# Patient Record
Sex: Female | Born: 1965 | Race: White | Hispanic: No | Marital: Married | Smoking: Never smoker
Health system: Southern US, Community
[De-identification: ages and names within clinical notes are randomized; demographics above are authoritative.]

---

## 1998-07-19 ENCOUNTER — Other Ambulatory Visit: Admission: RE | Admit: 1998-07-19 | Discharge: 1998-07-19 | Payer: Self-pay | Admitting: Obstetrics and Gynecology

## 2000-06-23 ENCOUNTER — Other Ambulatory Visit: Admission: RE | Admit: 2000-06-23 | Discharge: 2000-06-23 | Payer: Self-pay | Admitting: Obstetrics and Gynecology

## 2000-07-06 ENCOUNTER — Encounter: Payer: Self-pay | Admitting: Obstetrics and Gynecology

## 2000-07-06 ENCOUNTER — Ambulatory Visit (HOSPITAL_COMMUNITY): Admission: RE | Admit: 2000-07-06 | Discharge: 2000-07-06 | Payer: Self-pay | Admitting: Obstetrics and Gynecology

## 2005-04-18 ENCOUNTER — Ambulatory Visit (HOSPITAL_BASED_OUTPATIENT_CLINIC_OR_DEPARTMENT_OTHER): Admission: RE | Admit: 2005-04-18 | Discharge: 2005-04-18 | Payer: Self-pay | Admitting: Family Medicine

## 2005-05-04 ENCOUNTER — Ambulatory Visit: Payer: Self-pay | Admitting: Internal Medicine

## 2005-06-25 ENCOUNTER — Other Ambulatory Visit: Admission: RE | Admit: 2005-06-25 | Discharge: 2005-06-25 | Payer: Self-pay | Admitting: Family Medicine

## 2007-11-10 ENCOUNTER — Ambulatory Visit (HOSPITAL_COMMUNITY): Admission: RE | Admit: 2007-11-10 | Discharge: 2007-11-10 | Payer: Self-pay | Admitting: Obstetrics and Gynecology

## 2007-11-10 ENCOUNTER — Encounter (INDEPENDENT_AMBULATORY_CARE_PROVIDER_SITE_OTHER): Payer: Self-pay | Admitting: Obstetrics and Gynecology

## 2007-12-01 ENCOUNTER — Inpatient Hospital Stay (HOSPITAL_COMMUNITY): Admission: RE | Admit: 2007-12-01 | Discharge: 2007-12-07 | Payer: Self-pay | Admitting: Obstetrics and Gynecology

## 2007-12-01 ENCOUNTER — Encounter (INDEPENDENT_AMBULATORY_CARE_PROVIDER_SITE_OTHER): Payer: Self-pay | Admitting: Obstetrics and Gynecology

## 2007-12-06 ENCOUNTER — Encounter: Payer: Self-pay | Admitting: Urology

## 2007-12-15 ENCOUNTER — Ambulatory Visit (HOSPITAL_COMMUNITY): Admission: RE | Admit: 2007-12-15 | Discharge: 2007-12-15 | Payer: Self-pay | Admitting: Urology

## 2008-01-26 ENCOUNTER — Encounter: Admission: RE | Admit: 2008-01-26 | Discharge: 2008-01-26 | Payer: Self-pay | Admitting: Urology

## 2008-02-03 ENCOUNTER — Ambulatory Visit: Admission: RE | Admit: 2008-02-03 | Discharge: 2008-05-03 | Payer: Self-pay | Admitting: Radiation Oncology

## 2008-02-17 ENCOUNTER — Ambulatory Visit (HOSPITAL_COMMUNITY): Admission: RE | Admit: 2008-02-17 | Discharge: 2008-02-17 | Payer: Self-pay | Admitting: Urology

## 2008-03-15 ENCOUNTER — Encounter: Admission: RE | Admit: 2008-03-15 | Discharge: 2008-03-15 | Payer: Self-pay | Admitting: Interventional Radiology

## 2008-03-21 ENCOUNTER — Encounter: Admission: RE | Admit: 2008-03-21 | Discharge: 2008-03-21 | Payer: Self-pay | Admitting: Interventional Radiology

## 2008-04-06 ENCOUNTER — Inpatient Hospital Stay (HOSPITAL_COMMUNITY): Admission: RE | Admit: 2008-04-06 | Discharge: 2008-04-10 | Payer: Self-pay | Admitting: Urology

## 2008-04-06 ENCOUNTER — Encounter (INDEPENDENT_AMBULATORY_CARE_PROVIDER_SITE_OTHER): Payer: Self-pay | Admitting: Urology

## 2008-07-14 ENCOUNTER — Ambulatory Visit (HOSPITAL_COMMUNITY): Admission: RE | Admit: 2008-07-14 | Discharge: 2008-07-14 | Payer: Self-pay | Admitting: Urology

## 2008-08-23 ENCOUNTER — Ambulatory Visit (HOSPITAL_BASED_OUTPATIENT_CLINIC_OR_DEPARTMENT_OTHER): Admission: RE | Admit: 2008-08-23 | Discharge: 2008-08-23 | Payer: Self-pay | Admitting: Orthopedic Surgery

## 2008-10-07 IMAGING — CR DG CHEST 2V
2 series · 2 of 2 positions shown · non-contrast
Comparison: None available

CLINICAL DATA: Left ureteral obstruction, preoperative radiograph.

CHEST - 2 VIEW

[w chest pa *]
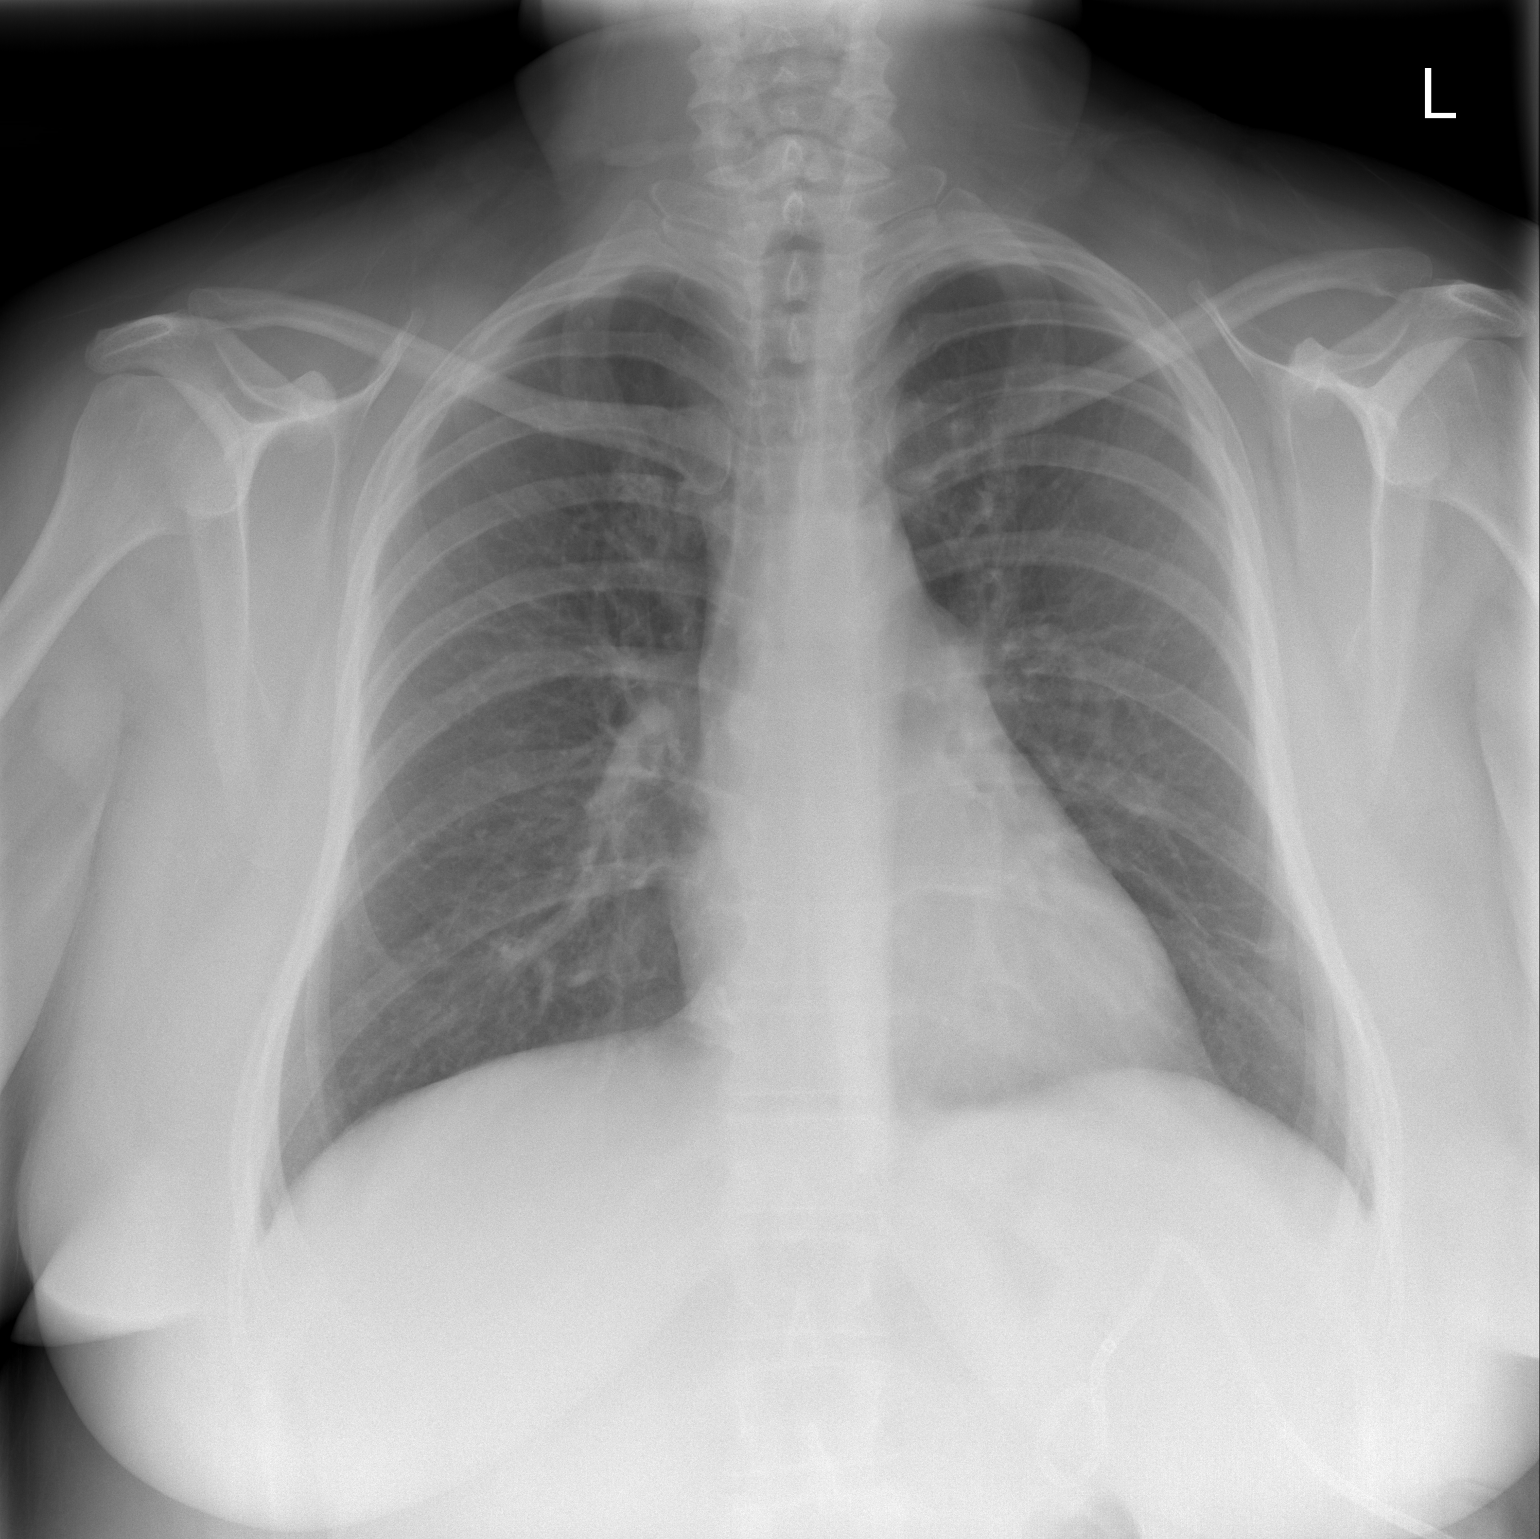

[w chest lat *]
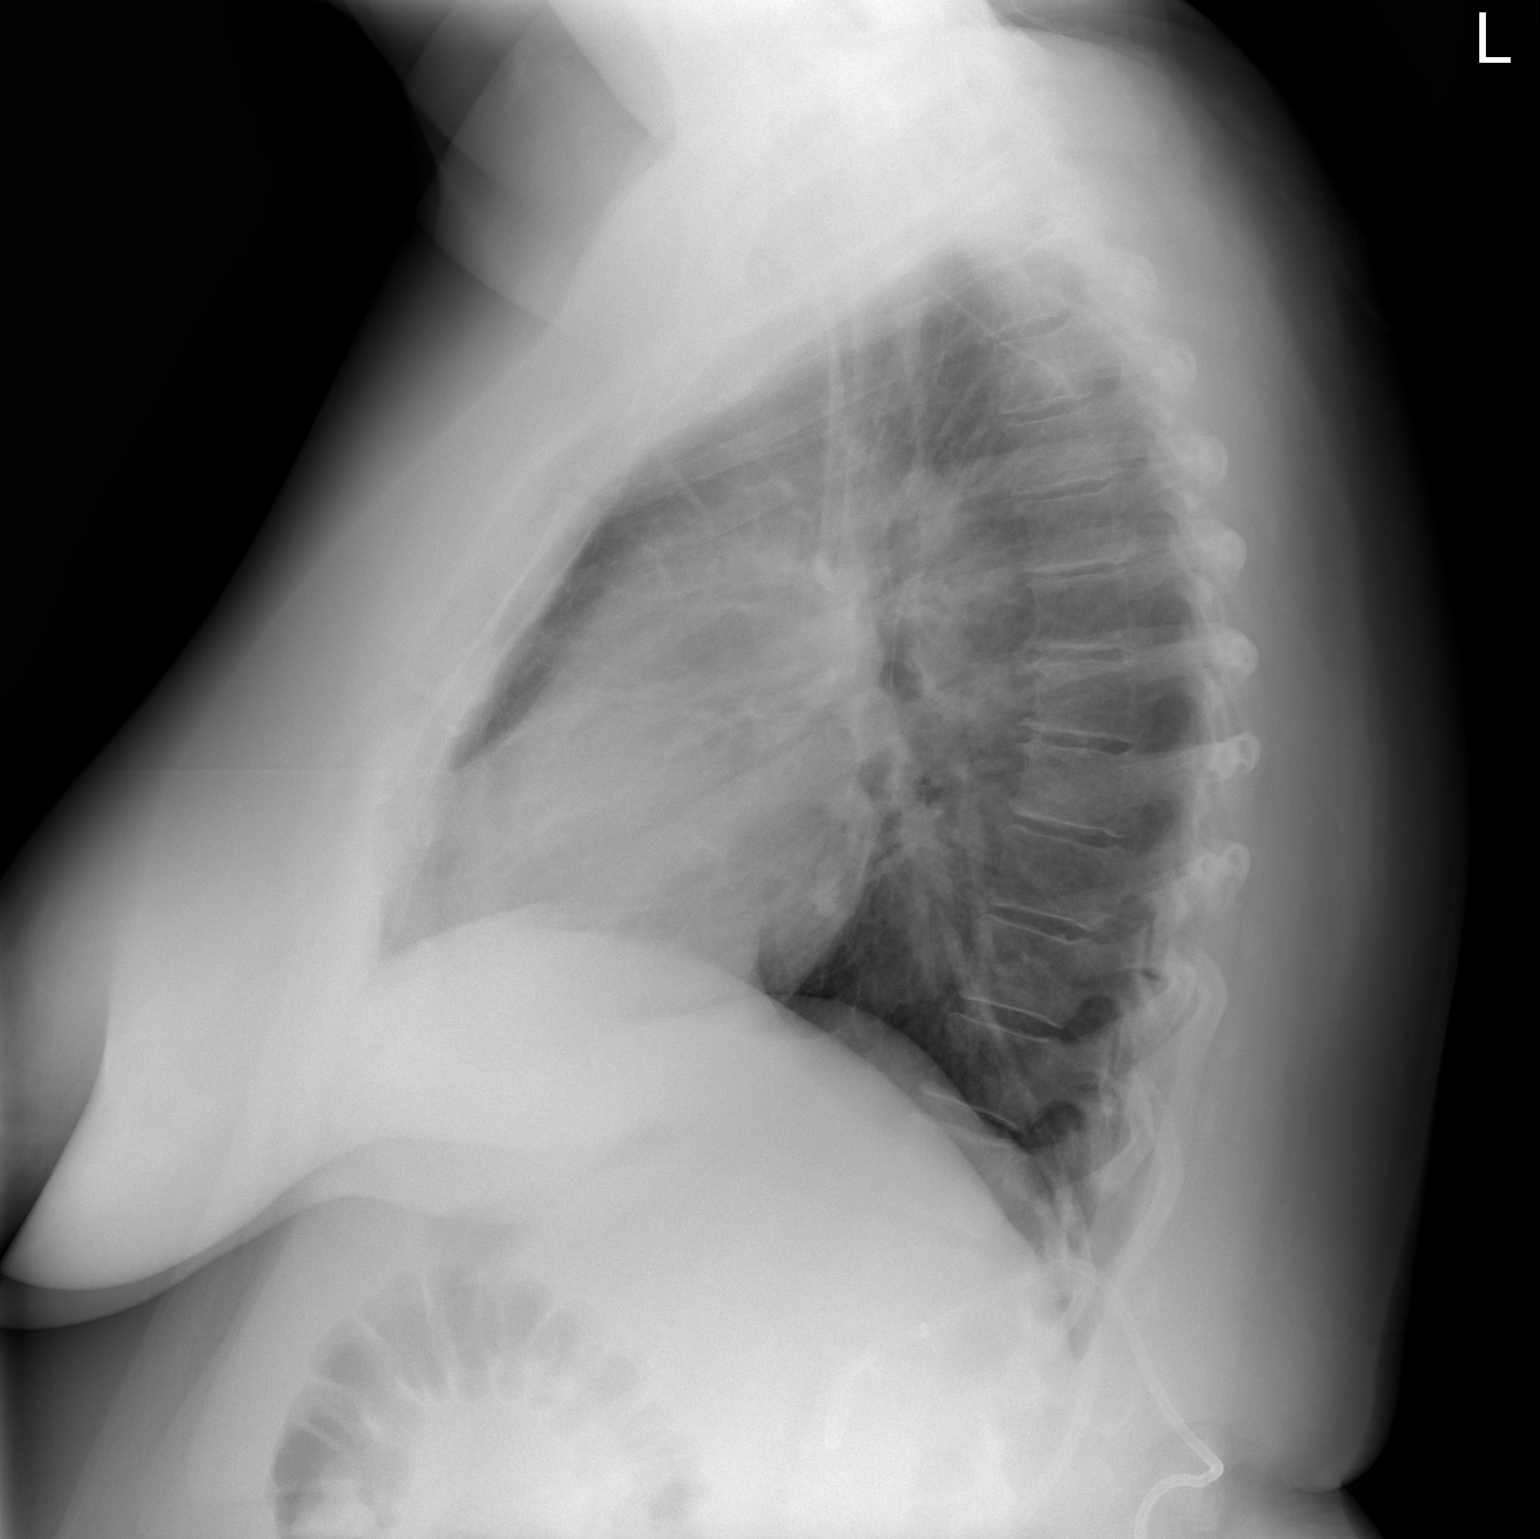

[2 of 2 positions shown; findings below may reference images not displayed]

FINDINGS: Lungs clear.  Cardiopericardial silhouette appears within
normal limits.  No edema, effusions, airspace disease.  Trachea
midline.  Nephrostomy tube present in the left kidney. Tiny
calcified granuloma or bone island is projected over the right
first rib.
IMPRESSION: 1.  No active cardiopulmonary disease.
2.  Left kidney nephrostomy.

## 2008-11-29 IMAGING — XA IR NEPHRO TUBE REMOV/FLUORO
1 series · 3 of 3 positions shown · non-contrast
Comparison: none

EXAMINATION:

ANTEGRADE LEFT NEPHROSTOGRAM
LEFT NEPHROSTOMY REMOVAL UNDER FLUOROSCOPY
CLINICAL DATA: Previous left ureteral obstruction, endometrial
carcinoma status post left internal ureteral stent.

[Series 1000: run · 0.15mm/px · 3 of 3 slices shown]
[im 1/3]
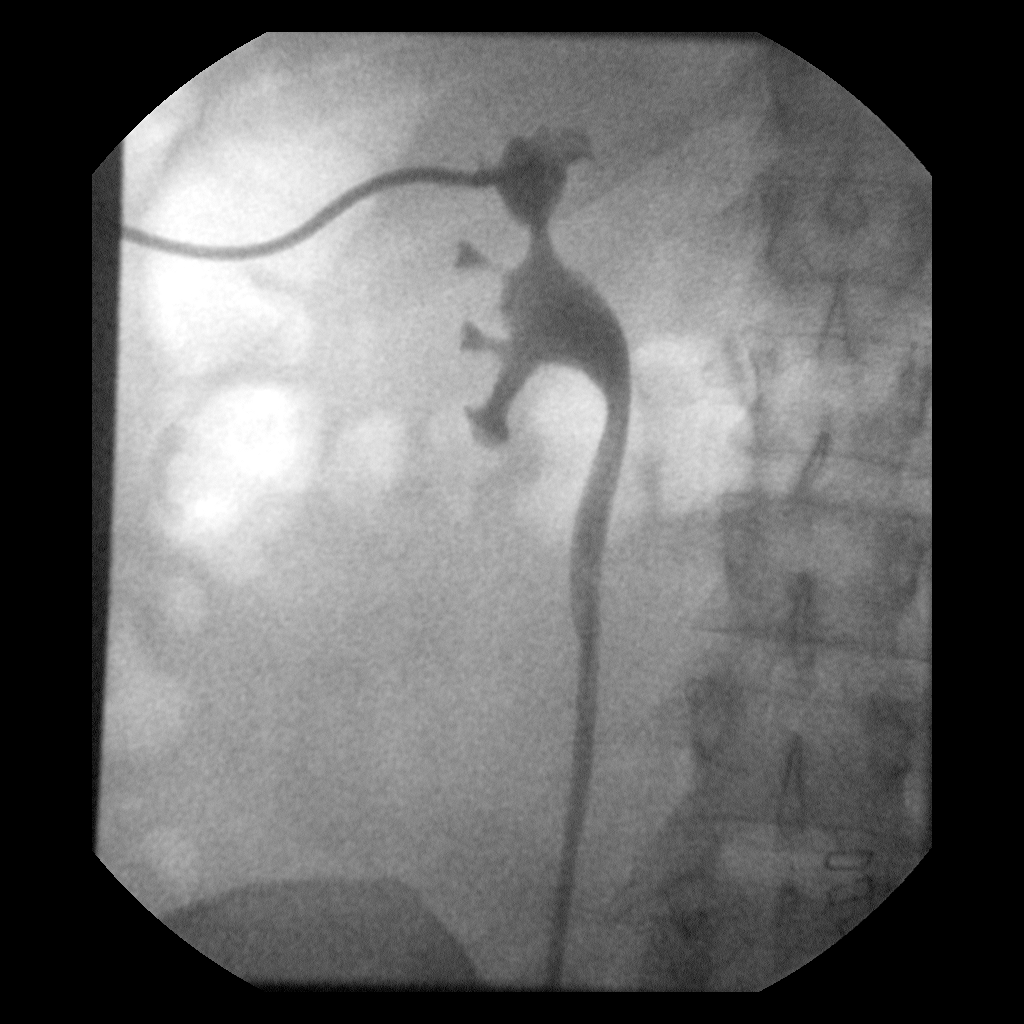
[im 2/3]
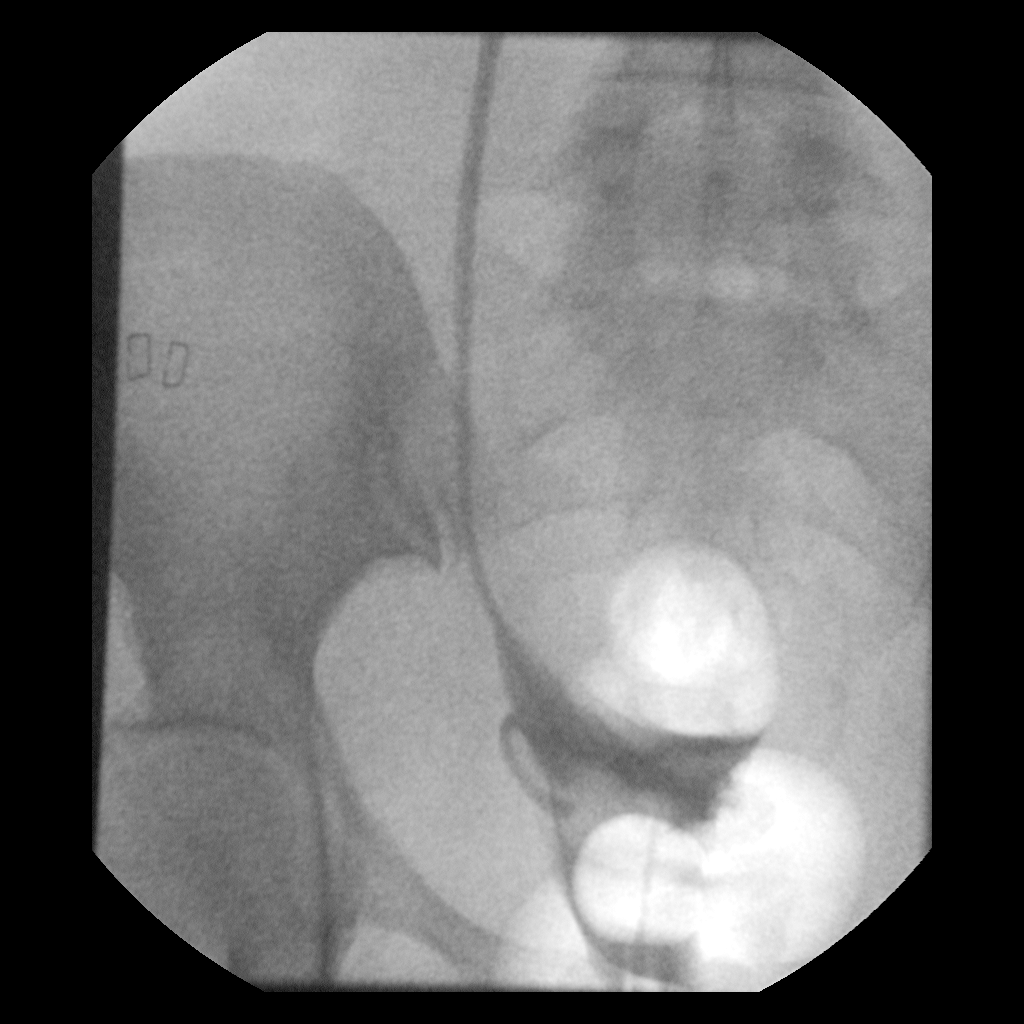
[im 3/3]
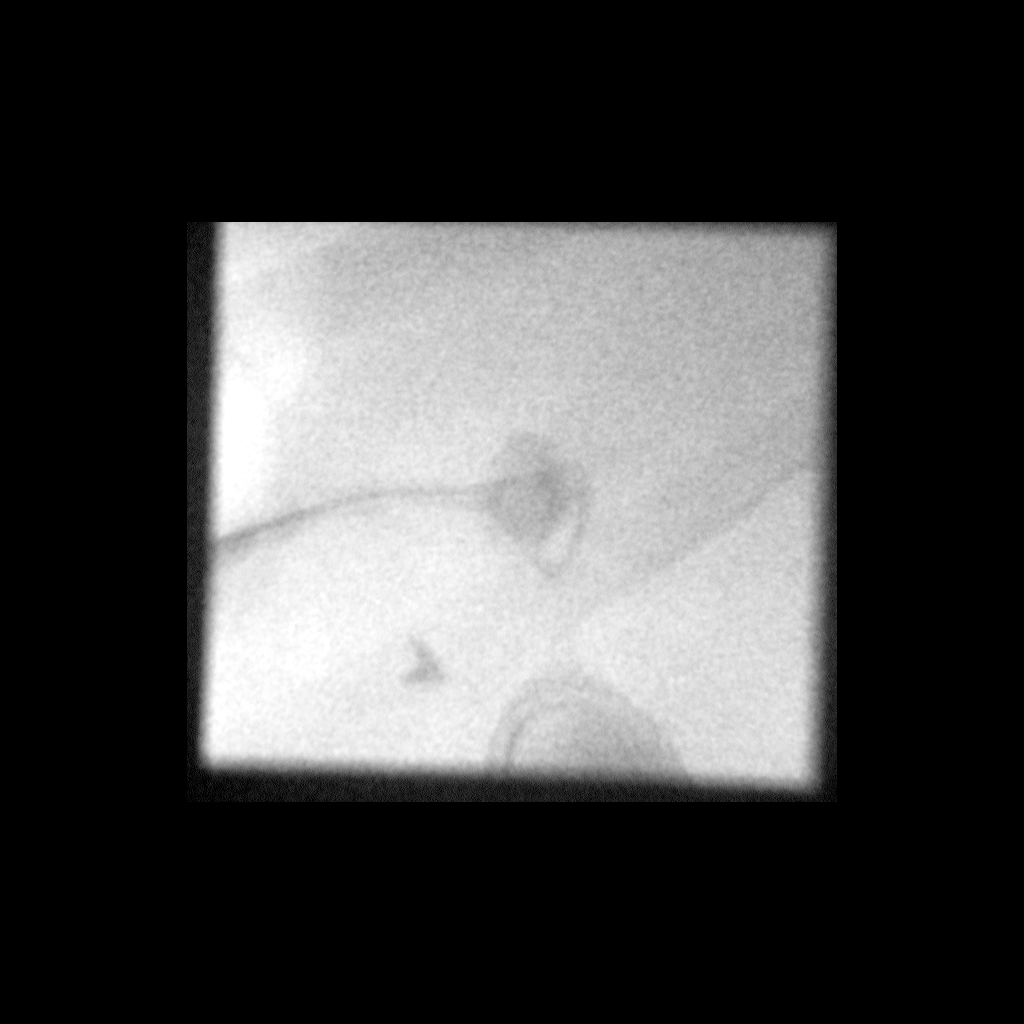

[3 of 3 positions shown; findings below may reference images not displayed]

Date:04/10/2008 [DATE]

Radiologist:ACEITUNO.Stasia, M.D.

Medications:None.

Guidance:Fluoroscopic

Fluoroscopy time:0.7 minutes

Sedation time:None.

Contrast volume:20 ml Rmnipaque-HII

Complications:No immediate

PROCEDURE/FINDINGS:

Informed consent was obtained from the patient following
explanation of the procedure, risks, benefits and alternatives.
The patient understands, agrees and consents for the procedure.
All questions were addressed.  A time out was performed.

Under sterile conditions, the existing left nephrostomy catheter
was injected for antegrade nephrostogram under fluoroscopy.  This
demonstrates patency of the collecting system without obstruction
or hydronephrosis.  The ureter and stent are patent.  Contrast
opacifies the bladder.  No evidence of leakage or extravasation.
Left nephrostomy catheter has retracted into the upper pole calix.

Left nephrostomy removal:  The left nephrostomy was cut and removed
over a Bentson guidewire without complication.
IMPRESSION: Patent left internal ureteral stent with good antegrade
flow into the bladder.  Negative for obstruction.

Uncomplicated removal of the left nephrostomy under fluoroscopy

## 2009-03-16 ENCOUNTER — Encounter: Admission: RE | Admit: 2009-03-16 | Discharge: 2009-03-16 | Payer: Self-pay

## 2009-03-30 ENCOUNTER — Ambulatory Visit (HOSPITAL_COMMUNITY): Admission: RE | Admit: 2009-03-30 | Discharge: 2009-03-30 | Payer: Self-pay | Admitting: Urology

## 2009-11-04 IMAGING — CR DG CHEST 2V
2 series · 2 of 2 positions shown · non-contrast
Comparison: 02/17/2008

CLINICAL DATA: Endometrial cancer

CHEST - 2 VIEW

[w chest pa]
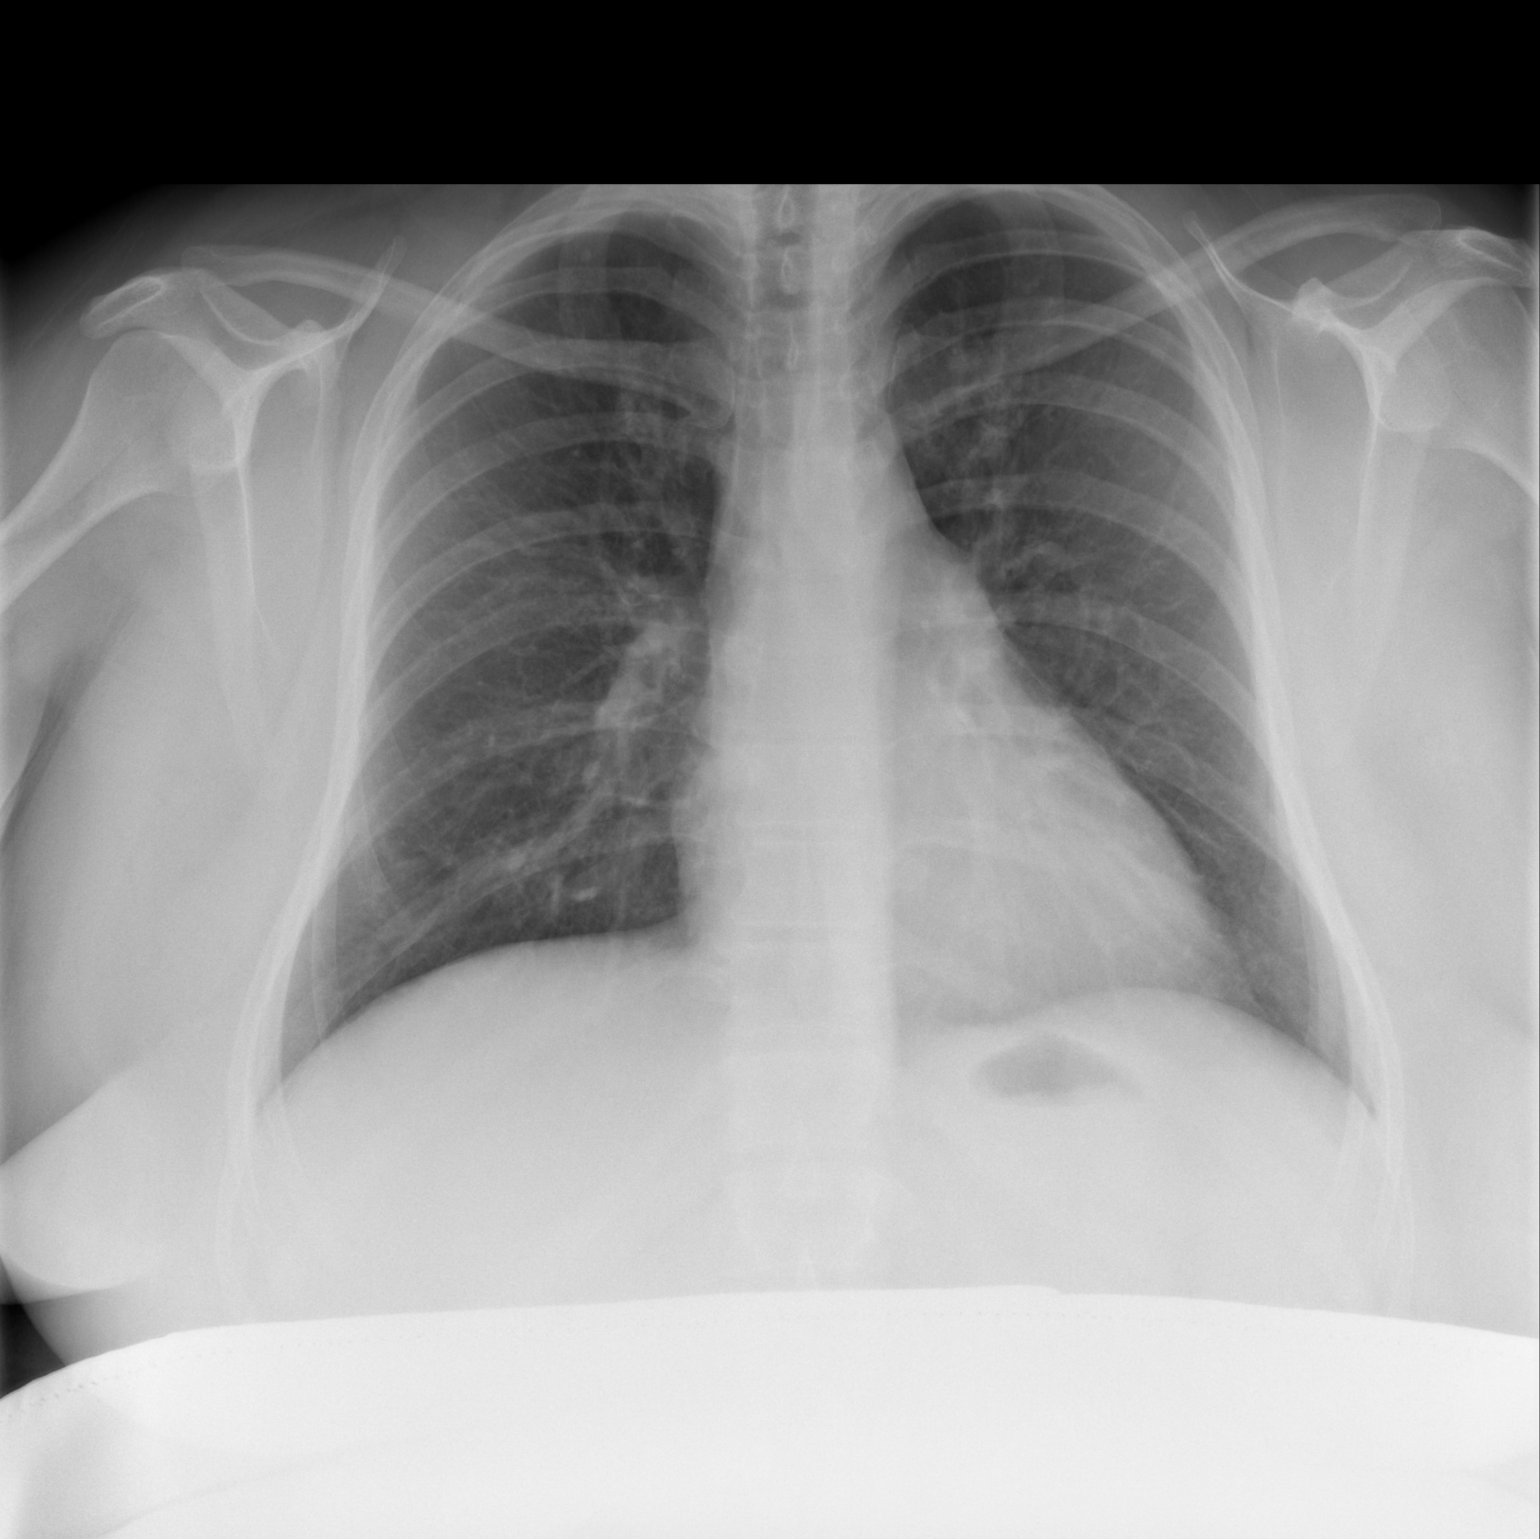

[w chest lat]
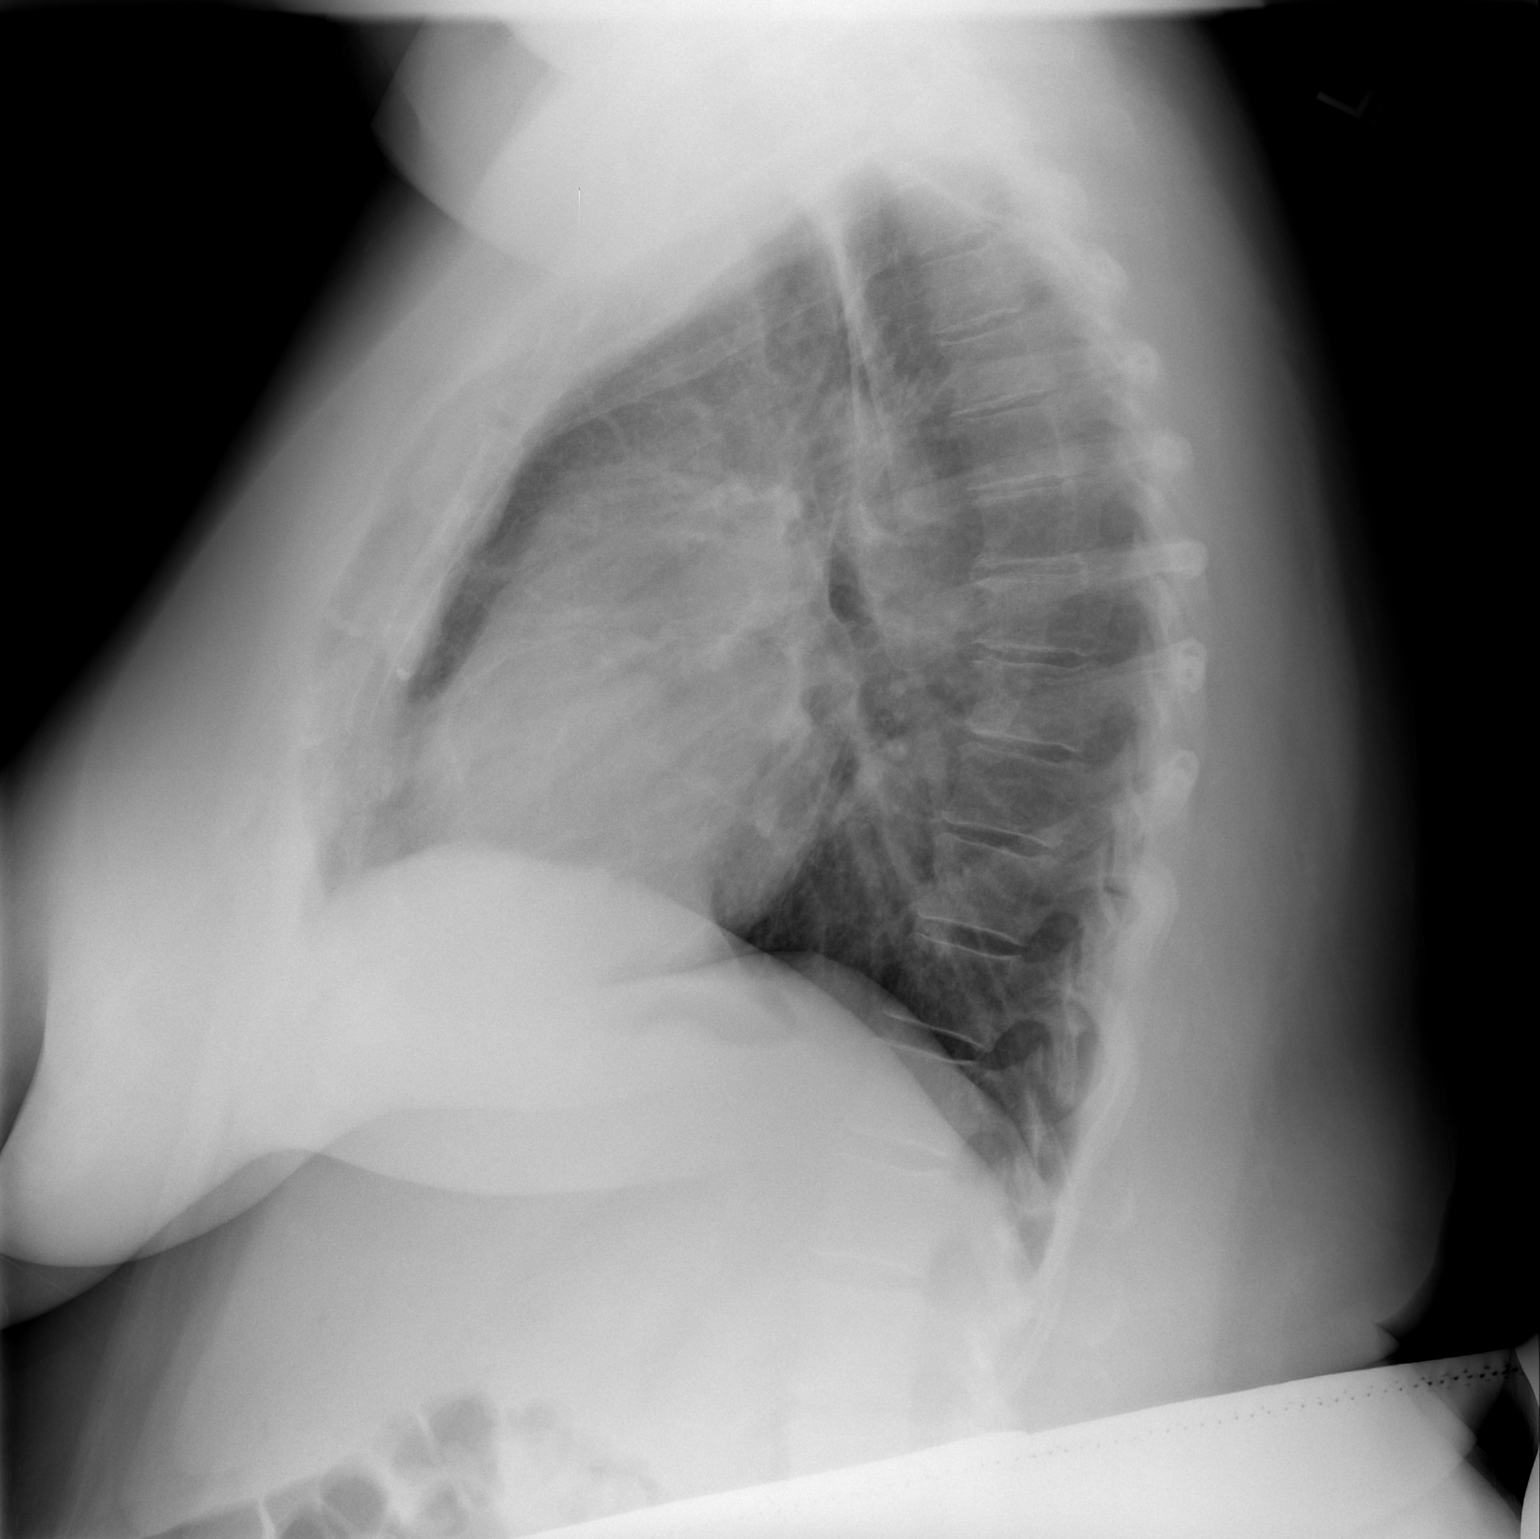

[2 of 2 positions shown; findings below may reference images not displayed]

FINDINGS: The lungs are clear without focal infiltrate, edema,
pneumothorax or pleural effusion. Tiny nodule at the right apex is
stable, consistent with a granuloma. The cardiopericardial
silhouette is within normal limits for size. Imaged bony structures
of the thorax are intact.
IMPRESSION: Stable.  No acute cardiopulmonary process.

## 2010-03-08 ENCOUNTER — Encounter: Admission: RE | Admit: 2010-03-08 | Discharge: 2010-03-08 | Payer: Self-pay | Admitting: Obstetrics and Gynecology

## 2010-11-10 ENCOUNTER — Encounter: Payer: Self-pay | Admitting: Urology

## 2010-11-10 ENCOUNTER — Encounter: Payer: Self-pay | Admitting: Diagnostic Radiology

## 2011-03-04 NOTE — Op Note (Signed)
[name], [name]                ACCOUNT NO.:  [id number]   MEDICAL RECORD NO.:  [id number]          PATIENT TYPE:  AMB   LOCATION:  DSC                          FACILITY:  [facility]   PHYSICIAN:  Harvie Junior, M.D.   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   PREOPERATIVE DIAGNOSES:  Lateral meniscal tear with chondromalacia, the  patellofemoral joint, and the medial condyle.   POSTOPERATIVE DIAGNOSES:  1. Lateral meniscal tear with chondromalacia.  2. Patellofemoral joint.  3. Medial condyle.  4. Cartilaginous loose body.   OPERATIVE PROCEDURE:  1. Arthroscopic knee surgery with partial lateral meniscectomy.  2. Chondroplasty medial femoral condyle.  3. Chondroplasty patellofemoral joint.  4. Removal of osteocartilaginous loose body.   SURGEON:  Harvie Junior, M.D.   ASSISTANT:  Marshia Ly, P.A.   ANESTHESIA:  General.   BRIEF HISTORY:  [name] is a 45 year old female with a long history of  having significant right knee pain.  She has had quite a lot of problems  with the range of motion of the knee.  MRI was obtained which showed  significant lateral meniscal tear with questionable chondral injury in  the medial side.  We treated conservatively for a period of time with  anti-inflammatory medication, activity modification, injection therapy,  all these failed and consequently because of complaints of pain she was  ultimately taken to the operating room for operative knee arthroscopy.   PROCEDURE:  The patient was taken to the operating room.  After adequate  anesthesia obtained with general anesthetic, the patient was placed  supine on the operating table.  The right leg was then prepped and  draped in usual sterile fashion.  Following routine arthroscopic  examination of the knee revealed there was an obvious lateral  meniscal  tear in the midbody, and this was derided back to a smooth and a stable  rim.  Attention was turned to the medial compartment medial compartment  where there was noted to be fairly large probably 2 x 3 cm area of  significant cartilaginous injury and this was grade 2 and grade 3 in  nature.  I did not see exposed bone, but certainly was quite  significant.  Correspondingly on the medial tibial plateau there was a  grade 2 injury to the medial meniscus and the midbody was fine.  Posteriorly, the medial meniscal was also fine.  We did a chondroplasty  of the medial femoral chondral.  ACL was normal.  Attention was turned  to the patellofemoral joint, where there was grade 2 and 3 change in the  posterior aspect of the patella.  This was debrided as well while we  were in the lateral compartment, the large cartilaginous loose body came  into view.  We were able to remove this as well.  At this time,  the knee was copiously and thoroughly irrigated and suctioned dry.  All  sterile portals were closed with bandages,  Sterile compression dressing  was applied.  The patient was taken to the recovery room and was noted  to be in satisfactory condition.  Estimated blood loss for this  procedure was none.      Harvie Junior, M.D.  Electronically Signed     JLG/MEDQ  D:  08/23/2008  T:  08/24/2008  Job:  [id number]

## 2011-03-04 NOTE — Op Note (Signed)
NAMEHAVA, MASSINGALE                ACCOUNT NO.:  [id number]   MEDICAL RECORD NO.:  [id number]          PATIENT TYPE:  AMB   LOCATION:  SDC                           FACILITY:  [facility]   PHYSICIAN:  Gerald Leitz, MD          DATE OF BIRTH:  [birth year]   DATE OF PROCEDURE:  11/10/2007  DATE OF DISCHARGE:                               OPERATIVE REPORT   PREOPERATIVE DIAGNOSIS:  Menorrhagia, possible endometrial mass.   POSTOPERATIVE DIAGNOSIS:  Menorrhagia, possible endometrial mass.   PROCEDURE:  Hysteroscopy, dilation and curettage.   SURGEON:  Gerald Leitz, M.D.   ASSISTANT:  None.   ANESTHESIA:  General.   FINDINGS:  14 week size uterus on bimanual exam with clots in the  endometrial canal.   SPECIMEN:  To pathology.   ESTIMATED BLOOD LOSS:  50 mL.   SORBITOL DEFICIT:  100 mL.   COMPLICATIONS:  None.   INDICATIONS:  This is a 45 year old with menorrhagia who failed  endometrial biopsy in the office. Informed consent was obtained.   PROCEDURE IN DETAIL:  The patient was taken to the operating room where  she was placed under general anesthesia.  She was placed in the dorsal  lithotomy position, prepped and draped in the usual sterile fashion.  A  bivalve speculum was placed into the vaginal vault.  The anterior lip of  the cervix was grasped with a single tooth tenaculum.  The uterus was  then sounded to 11 cm.  Hegar dilators were used to dilate the cervix up  to a number 18 Hegar dilator.  The hysteroscope was inserted with the  findings noted above. A sharp curette was inserted and a sharp curettage  was performed all the way around.  The hysteroscope was then reinserted,  no perforation was noted,  the hysteroscope was removed.  The single tooth tenaculum was removed  from the anterior lip of the cervix.  Excellent hemostasis was noted.  The bivalve speculum was removed.  The patient was awakened from  anesthesia and taken to the recovery room awake and in stable  condition.      Gerald Leitz, MD  Electronically Signed     TC/MEDQ  D:  11/10/2007  T:  11/10/2007  Job:  [id number]

## 2011-03-04 NOTE — Consult Note (Signed)
NAMELEILANA, [name]                ACCOUNT NO.:  [id number]   MEDICAL RECORD NO.:  [id number]          PATIENT TYPE:  INP   LOCATION:  9304                          FACILITY:  [facility]   PHYSICIAN:  Heloise Purpura, MD      DATE OF BIRTH:  [birth year]   DATE OF CONSULTATION:  12/03/2007  DATE OF DISCHARGE:                                 CONSULTATION   REASON FOR CONSULTATION:  Left hydronephrosis and renal insufficiency.   REQUESTING PHYSICIAN:  Dr. Gerald Leitz   HISTORY OF PRESENT ILLNESS:  Ms. [name] is a 45 year old female who is now  postoperative day #2 status post a transabdominal hysterectomy performed  secondary to endometrial hyperplasia.  She was noted to be anemic  preoperatively and estimated blood loss during the procedure was  approximately 550 mL.  She did receive 2 units of packed red blood cells  and significant IV fluid hydration postoperatively.  She was noted to  have a rising serum creatinine postoperatively with her baseline being  0.76.  This increased to 1.3 yesterday and to 1.66 today.  She,  therefore, underwent a CT scan with contrast and delayed images.  This  reportedly demonstrated delayed uptake and excretion of contrast of the  left kidney with some dilation of the left renal collecting system;  therefore, indicating a possibility for a left ureteral injury or  obstruction.  No ureteral calculi were identified.   Ms. [name] states that she has had some very mild left flank discomfort  throughout today.  She denies nausea, vomiting, or fevers.  She denies  hematuria and has been tolerating her diet without difficulty, and  ambulating without significant difficulty.  She denies a history of  voiding dysfunction or voiding symptoms.  She denies a history of  hematuria, urinary tract infections, urolithiasis, GU malignancy,  trauma, or surgery.   PAST MEDICAL HISTORY:  1. History of migraine headaches.  2. Obesity.   PAST SURGICAL HISTORY:  Transabdominal  hysterectomy.   MEDICATIONS:  1. Provera.  2. Flonase.  3. Midrin.  4. Advil.  5. Axert.   ALLERGIES:  NO KNOWN DRUG ALLERGIES.   FAMILY HISTORY:  Positive for uterine cancer, breast cancer, and ovarian  cancer.   SOCIAL HISTORY:  The patient is married.  She denies tobacco use.  She  drinks alcohol occasionally.  She denies any illicit drug use.   REVIEW OF SYSTEMS:  A complete review of systems was reviewed.  Pertinent positives include mild left flank pain.  Pertinent negatives  include no fever, nausea or vomiting, or hematuria.   PHYSICAL EXAMINATION:  VITAL SIGNS:  Temperature 98.3, heart rate 96,  blood pressure 138/77, respirations 18.  Urine output last shift was 700  mL.  CONSTITUTIONAL:  Well-nourished, well-developed, age-appropriate female  in no acute distress.  NECK:  No neck masses or JVD.  HEENT:  Normocephalic, atraumatic.  CARDIOVASCULAR:  Trace peripheral edema.  PULMONARY:  Normal respiratory rate and effort.  ABDOMEN:  Significantly obese with a lower midline incision which is  healing well.  There is no significant drainage from her wound.  She  does have mild left CVA tenderness on palpation.  No right CVA  tenderness.  No significant abdominal tenderness.  NEUROLOGIC:  Grossly intact.  PSYCHIATRIC:  Normal mood and affect.   LABORATORY STUDIES:  Serum creatinine today is 1.66, hemoglobin is 8.5.  White blood count 11.9.  Baseline creatinine is 0.76.  Urinalysis is  nitrite and leukocyte esterase negative with a large amount of dipstick-  positive blood.  The patient was also noted to have a large amount of  dipstick-positive blood in January 2009 with too numerous to count red  blood cells on her microscopic analysis at that time.   IMAGING STUDIES:  The patient's CT scan was independently reviewed.  This does demonstrate delayed uptake and excretion of contrast within  the left kidney.  There is mild dilation and likely hydronephrosis of  the  left renal collecting system with mild dilation of the left ureter.  No contrast is seen extending down the ureter and the distal left ureter  cannot be assessed for a definite injury or source of obstruction.  No  ureteral calculi are identified.   IMPRESSION:  1. Renal insufficiency.  2. Left hydronephrosis with possible ureteral obstruction versus      injury.  3. Hematuria.   RECOMMENDATIONS:  1. Her renal insufficiency is likely related to a prerenal etiology      and I would expect this to improve with continued hydration.  This      would not be explained by unilateral obstruction, considering the      fact that the patient does not have any preexisting renal      dysfunction or risk factors for global renal dysfunction.  2. Left hydronephrosis is possibly related to ureteral obstruction or      injury.  I have recommended that the patient undergo further      assessment with cystoscopy, retrograde pyelography and possible      ureteral stent placement.  This will be performed tomorrow morning      and the patient has been informed of the risks/benefits, potential      complications, and alternative options to this procedure.  She      understands that she could potentially require percutaneous      nephrostomy drainage and possibly an attempt at antegrade ureteral      stent placement as well as possible delayed repair of any potential      ureteral injury.  3. Hematuria:  This will be evaluated further following management of      the patient's acute problems stated above and she may require      further outpatient workup and evaluation.     Heloise Purpura, MD  Electronically Signed    LB/MEDQ  D:  12/03/2007  T:  12/06/2007  Job:  [id number]   cc:   Gerald Leitz, MD

## 2011-03-04 NOTE — Op Note (Signed)
[name], [name]                ACCOUNT NO.:  [id number]   MEDICAL RECORD NO.:  [id number]          PATIENT TYPE:  AMB   LOCATION:  DAY                          [facility]ACILITY[facility]   PHYSICIAN:  Heloise Purpura, MD      DATE OF BIRTH:  [birth year]   DATE OF PROCEDURE:  02/17/2008  DATE OF DISCHARGE:                               OPERATIVE REPORT   PREOPERATIVE DIAGNOSIS:  Left ureteral obstruction.   POSTOPERATIVE DIAGNOSIS:  Left ureteral obstruction.   PROCEDURE:  1. Cystoscopy.  2. Left retrograde pyelography.  3. Left antegrade nephrostogram.   SURGEON:  Dr. Heloise Purpura   ANESTHESIA:  General.   COMPLICATIONS:  None.   ESTIMATED BLOOD LOSS:  None.   INDICATIONS:  [name] is a 45 year old female with left ureteral  obstruction after an injury to the left ureter during a hysterectomy.  This has been managed with a left nephrostomy tube and she follows up  today for further evaluation of her left ureteral obstruction in  preparation for definitive repair and reconstruction in the near future.  The potential risks, complications, alternative treatment options,  etcetera to the above procedures were discussed and informed consent was  obtained.   DESCRIPTION OF PROCEDURE:  The patient was taken to the operating room  and an LMA was introduced with a general anesthetic.  The patient was  placed in the dorsal lithotomy position, prepped and draped in the usual  sterile fashion.  She was administered broad-spectrum preoperative  antibiotics, considering her indwelling nephrostomy tube.  In addition,  she had been on culture-specific antibiotics beginning 2 days prior to  this procedure.  A preoperative time-out was performed.   Cystourethroscopy was then performed which demonstrated some edema and  erythema around the left ureteral orifice.  The remainder of the bladder  was free of any bladder tumors or other abnormalities.  The right  ureteral orifice was in the normal  anatomic position and effluxing clear  urine.  A 6 French ureteral catheter was then used to intubate the left  ureter and contrast was injected.  There was noted to be complete  obstruction of the left ureter approximately 3 cm above the ureteral  orifice.  Prior ureteroscopic attempts had been unable to find any  definite lumen.  Ureteroscopy was not performed at this time.  Instead,  contrast was injected through her indwelling nephrostomy tube which  demonstrated a normal renal pelvis with the nephrostomy tube curled in  the renal pelvis.  The proximal ureter filled out normally down to the  distal aspect where, again, complete ureteral obstruction was  identified.  There was a gap of approximately 3 cm as simultaneous  retrograde contrast was also injected.  It was felt  that based on these findings and the distance between the edges of the  ureter, an attempt at ureteroscopy and stent placement would not be  indicated.  Therefore, the procedure was terminated at this point.   The patient was able to be awakened and transferred to the recovery unit  in satisfactory condition.  Heloise Purpura, MD  Electronically Signed     LB/MEDQ  D:  02/17/2008  T:  02/17/2008  Job:  [id number]

## 2011-03-04 NOTE — H&P (Signed)
NAMEKELSE, PLOCH                ACCOUNT NO.:  [id number]   MEDICAL RECORD NO.:  [id number]          PATIENT TYPE:  AMB   LOCATION:  SDC                           FACILITY:  [facility]   PHYSICIAN:  Gerald Leitz, MD          DATE OF BIRTH:  10/12/1966   DATE OF ADMISSION:  DATE OF DISCHARGE:                              HISTORY & PHYSICAL   HISTORY OF PRESENT ILLNESS:  A 45 year old G3, P3, with menorrhagia  __________.  Endometrial biopsy was attempted in the office on November 08, 2007 and was unsuccessful.   PAST MEDICAL HISTORY:  1. Migraine headaches.  2. Obesity.   OB HISTORY:  Spontaneous vaginal delivery x3.   GYN HISTORY.:  Menarche at age 9.  Contraception:  Husband had a  vasectomy.  Cycle length 25 days, lasting up to 10 days.  No history of  sexually transmitted diseases.   CURRENT MEDICATIONS:  Provera, Midrin, Flonase, Advil, Axert.   FAMILY HISTORY:  Positive for uterine cancer, breast cancer, ovarian  cancer.   SOCIAL HISTORY:  The patient is married, tobacco use, occasional  alcohol, no illicit drug use.   REVIEW OF SYSTEMS:  Positive for __________,  otherwise negative except  for History of Present Illness.   PHYSICAL EXAMINATION:  VITAL SIGNS:  Blood pressure 152/90. Weight 192  pounds, height 67 inches.  CARDIOVASCULAR:  Regular rate and rhythm.  LUNGS:  Clear to auscultation bilaterally.  ABDOMEN:  Soft, nontender, nondistended.  Positive bowel sounds.  EXTREMITIES:  No clubbing, cyanosis, or edema.  PELVIC:  Normal external female genitalia.  Moderate amount of blood in  the vaginal vault.  Bimanual reveals approximately a 14-week size  uterus.  Slight right adnexal fullness.   Ultrasound done on January 19,2009 shows the patient to have a uterus  measuring 14.7 cm in length, AP diameter of 9.6 cm with __________of  9  cm.  She has a fundal submucosal fibroid at 3.9 cm.  Endometrium is  thick with a hypoechoic mass mass 0.8 x 2.6 cm and a complex  __________  of 1.3 cm.  Right ovary  has a simple cyst, 1.5 cm.  Left ovary has a  simple cyst, 3.0 cm, both avascular.   IMPRESSION AND PLAN:  A 45 year old with menorrhagia and __________.   RECOMMENDATIONS:  A hysteroscopy D&C, possible removal of __________  mass.  Risks, benefits and alternatives of the procedure including but  not limited to infection, bleeding, possible uterine perforation and  need for further surgery.  The patient voiced understanding of the risks  and desires to proceed.      Gerald Leitz, MD  Electronically Signed     TC/MEDQ  D:  11/08/2007  T:  11/08/2007  Job:  [id number]

## 2011-03-04 NOTE — Op Note (Signed)
[name], [name]                ACCOUNT NO.:  [id number]   MEDICAL RECORD NO.:  [id number]          PATIENT TYPE:  AMB   LOCATION:  DFTL                         [facility]ACILITY:  [facility]   PHYSICIAN:  Heloise Purpura, MD      DATE OF BIRTH:  July 27, 1966   DATE OF PROCEDURE:  12/04/2007  DATE OF DISCHARGE:  12/04/2007                               OPERATIVE REPORT   PREOPERATIVE DIAGNOSES:  1. Left hydronephrosis.  2. Renal insufficiency.   POSTOPERATIVE DIAGNOSES:  1. Left hydronephrosis.  2. Renal insufficiency.   PROCEDURES.:  1. Cystoscopy.  2. Left retrograde ureterography.  3. Left ureteroscopy.   SURGEON:  Dr. Heloise Purpura.   ANESTHESIA:  General.   COMPLICATIONS:  None.   INTRAOPERATIVE FINDINGS:  The patient was found to have what appears to  be a complete obstruction of the distal left ureter.   INDICATIONS:  [name] is a 45 year old female who is now three days  status post a transabdominal hysterectomy.  She was found to have a  rising creatinine postoperatively and did undergo a CT scan which  demonstrated mild left-sided hydronephrosis with some delay of IV  contrast uptake and excretion consistent with a possible obstruction of  the left ureter.  After discussion regarding these findings, she agreed  to proceed with further diagnostic evaluation and the above procedure.  Potential risks, complications, and alternative options were discussed  with the patient in detail and informed consent was obtained.   DESCRIPTION OF PROCEDURE:  The patient was taken to the operating room  and a general anesthetic was administered.  She was given preoperative  antibiotics, placed in the dorsal lithotomy position, and prepped and  draped in usual sterile fashion.  A preoperative time-out was performed.  Cystourethroscopy was then performed which demonstrated some mild edema  of the left ureteral orifice, although nothing that was significantly  out of the ordinary.  There were  no bladder tumors, stones, or other  mucosal pathology.  The left ureter was then intubated with a 6-French  ureteral catheter and contrast was injected.  Contrast was only seen in  the distal 4 to 5 cm of the ureter with subsequent extravasation of  contrast.  A Glidewire was then placed through the 6-French ureteral  catheter and was attempted to be advanced.  This curled in the distal  ureter.  At this point, it was decided to try to navigate the ureter  with a wire under direct ureteroscopic guidance.  The semi-rigid  ureteroscope was advanced into the distal left ureter and a Glidewire  was used to probe the ureter head of the ureteroscope.  Attempts were  unsuccessful to pass the wire up to the proximal ureter.  In the distal  ureter, there was an area that did appear to be completely obliterated  without evidence for a true ureteral lumen.  After multiple attempts to  try to find the ureteral lumen up to the proximal ureter, it was decided  to administer indigo carmine.  No blue dye was seen from the left  ureter.  At this point the  patient's bladder was emptied and the  procedure was ended.   She was able to be awakened and transferred to recovery in satisfactory  condition.  There no complications during the procedure.   I did discuss the patient situation with Dr. Gerald Leitz.  I have  recommended that the patient undergo a left percutaneous nephrostomy  tube placement, possible antegrade nephrostogram with possible antegrade  ureteral stent.      Heloise Purpura, MD  Electronically Signed     LB/MEDQ  D:  12/04/2007  T:  12/06/2007  Job:  [id number]

## 2011-03-04 NOTE — Discharge Summary (Signed)
[name], [name]                ACCOUNT NO.:  [id number]   MEDICAL RECORD NO.:  [id number]          PATIENT TYPE:  INP   LOCATION:  9304                          FACILITY:  [facility]   PHYSICIAN:  Gerald Leitz, MD          DATE OF BIRTH:  May 13, 1966   DATE OF ADMISSION:  12/01/2007  DATE OF DISCHARGE:  12/07/2007                               DISCHARGE SUMMARY   ADMITTING DIAGNOSIS:  Complex endometrial hyperplasia with atypia.   DISCHARGE DIAGNOSES:  1. Endometrioid adenocarcinoma.  2. Status post TAH-BSO (total abdominal hysterectomy-bilateral      salpingo-oophorectomy).  3. Left ureteral obstruction.   BRIEF HOSPITAL COURSE:  The patient was admitted on December 01, 2007.  She underwent total abdominal hysterectomy and bilateral salpingo-  oophorectomy secondary to complex endometrial hyperplasia with atypia  seen on previous D and C.  She was noted to have decreased urine output  during and after the procedure.  She also had blood loss of 550 mL  during the procedure and underwent a transfusion during the operation.  She received 2 units of packed red blood cells.  Urine output continued  to decrease after the procedure.  She received fluid boluses.  She had  an elevation of creatinine to 1.6.  Underwent a CT scan to evaluate for  possible ureteral injury and was noted to have obstruction of the left  ureter.  A urology consult was obtained.  She underwent cystoscopy and  left ureteroscopy on February 14.  The urologist was unable to pass  ureteral stent.  At that point interventional radiology was consulted to  place left percutaneous nephrostomy tube and this was performed on  December 03, 2007.  She did well post procedure with a hemoglobin of 8.0  and creatinine of 1.28 on February 17.   DISCHARGE MEDICATIONS:  Medications at discharge were Motrin and  Percocet.   ALLERGIES:  CARDURA.   DISPOSITION:  Condition stable.  The patient is discharged home to  follow up in 1 week  with Dr. Richardson Dopp, and in 2-3 weeks with urology.  Also  follow up with GYN oncologist in the next 1-2 weeks.  This will be  scheduled.  The patient will be called with appointment.      Gerald Leitz, MD  Electronically Signed     TC/MEDQ  D:  12/07/2007  T:  12/08/2007  Job:  [id number]

## 2011-03-04 NOTE — H&P (Signed)
[name], [name]                ACCOUNT NO.:  [id number]   MEDICAL RECORD NO.:  [id number]          PATIENT TYPE:  AMB   LOCATION:                                [facility]  [facility]   PHYSICIAN:  Gerald Leitz, MD          DATE OF BIRTH:  May 19, 1966   DATE OF ADMISSION:  12/01/2007  DATE OF DISCHARGE:                              HISTORY & PHYSICAL   HISTORY OF PRESENT ILLNESS:  This is a 45 year old G3, P3 with  menorrhagia secondary to complex hyperplasia with atypia.  She underwent  D and C and hysteroscopy on November 10, 2007, and pathology revealed  complex endometrial hyperplasia with atypia.   PAST MEDICAL HISTORY:  Migraine headaches and obesity.   OB HISTORY:  Spontaneous vaginal delivery x3.   GYN HISTORY:  Menarche at the age of 38.  Contraception, husband has had  a vasectomy.  No history of sexually transmitted diseases.   CURRENT MEDICATIONS:  Provera, Flonase, Midrin, Advil, Axert.   FAMILY HISTORY:  Positive for uterine cancer, breast cancer and ovarian  cancer.   SOCIAL HISTORY:  The patient is married, denies tobacco use, occasional  alcohol use.  No illicit drug use.   ALLERGIES:  No known drug allergies.   REVIEW OF SYSTEMS:  Negative except as stated in history of current  illness.   PHYSICAL EXAM:  VITAL SIGNS:  Weight 289 pounds, blood pressure 140/98,  height 67 inches.  CARDIOVASCULAR:  Regular rate and rhythm.  LUNGS:  Clear to auscultation bilaterally.  ABDOMEN:  Soft, nontender, nondistended.  Positive bowel sounds.  EXTREMITIES:  No clubbing, cyanosis or edema.  PELVIC:  Normal external female genitalia.  Moderate amount of blood in  the vaginal vault.  Bimanual exam reveals approximately 14 week size  uterus.  Slight adnexal fullness.  Ultrasound done on November 08, 2007,  shows the uterus to measure 14.7 x 9.6 x 10.9 cm.  The left ovary  contained a simple cyst 3.0 cm, the right ovary contains a simple cyst  4.5 cm, both appeared avascular.   IMPRESSION AND PLAN:  A 45 year old with complex hyperplasia with  atypia.  Risks, benefits and alternatives of hysterectomy were reviewed  with the patient including but not limited to infection, bleeding,  damage to bowel or bladder or surrounding organs with the need for  further surgery.  The patient is contemplating having her ovaries  removed and has not made a final decision.  She understands that if her  ovaries are removed she will undergo surgical menopause.  The patient is  aware of possible malignancy to be found at the time of surgery.  The  specimen will be sent for frozen section.  If malignancy is found will  consult surgery with node dissection and staging.  If this  cannot be performed at that time she was advised that she may require  further surgery for staging.  The patient voiced understanding and  desires to proceed with total abdominal hysterectomy and possible  bilateral salpingo-oophorectomy.      Gerald Leitz, MD  Electronically Signed     TC/MEDQ  D:  11/26/2007  T:  11/28/2007  Job:  [id number]

## 2011-03-04 NOTE — Op Note (Signed)
NAMERANEEN, JAFFER                ACCOUNT NO.:  [id number]   MEDICAL RECORD NO.:  [id number]          PATIENT TYPE:  INP   LOCATION:  9304                          [facility]ACILITY[facility]  [facility]   PHYSICIAN:  Gerald Leitz, MD          DATE OF BIRTH:  31-Jan-1966   DATE OF PROCEDURE:  12/01/2007  DATE OF DISCHARGE:                               OPERATIVE REPORT   PREOPERATIVE DIAGNOSIS:  1. Endometrial hyperplasia with atypia.  2. Menorrhagia.  3. Anemia.  4. Morbid obesity.   POSTOPERATIVE DIAGNOSIS:  1. Endometrial hyperplasia with atypia.  2. Menorrhagia.  3. Anemia.  4. Morbid obesity.   PROCEDURE:  Total abdominal hysterectomy and bilateral salpingo-  oophorectomy.   SURGEON:  Gerald Leitz, M.D.   ASSISTANT:  Bing Neighbors. Sydnee Cabal, M.D.   ANESTHESIA:  Spinal.   FINDINGS:  Diffusely enlarged uterus with left paratubal cyst. Normal  appearing ovaries.   SPECIMEN:  Uterus, bilateral fallopian tubes, and ovaries.   DISPOSITION OF SPECIMEN:  To pathology.   ESTIMATED BLOOD LOSS:  550 mL.   URINE OUTPUT:  80 mL.   FLUIDS:  2700 mL of LR.   BLOOD TRANSFUSED:  2 units packed red blood cells.   COMPLICATIONS:  None.   INDICATIONS:  A 45 year old with complex endometrial hyperplasia with  atypia and menorrhagia.   DESCRIPTION OF PROCEDURE:  The patient was taken to the operating room  where spinal anesthesia was placed and found to be adequate.  She was  then prepped and draped in the usual sterile fashion.  A vertical skin  incision was made with the scalpel and carried down to the underlying  layer of fascia.  The fascia was incised and this incision was extended  superiorly and inferiorly with Mayo scissors.  The peritoneum was  identified and entered bluntly.  A Balfour retractor was placed into the  abdominal cavity. The bowel was packed with moist laparotomy sponges.  The patient was found to have some adhesions along the left fallopian  tube and ovary, adhesions of the large  intestines and colon to the left  tube and ovary.  These were removed with Metzenbaum scissors.  Excellent  hemostasis was noted. The cornu of the uterus bilaterally was grasped  with Kelly clamps.  The uterus was elevated.  The round ligaments were  identified, suture ligated with 0 Vicryl, and then transected. The  bladder was dissected off with Metzenbaum scissors and a sponge stick.  The infundibulopelvic ligaments were clamped, transected, suture ligated  with a free tie of 0 Vicryl, followed by a Heaney ligature of 0 Vicryl.  This was performed bilaterally.  The uterine arteries were skeletonized.  The uterine arteries were then clamped with Heaney clamps, transected,  suture ligated with 0 Vicryl to improve visualization.  Both uterine  arteries were transected.  The uterus was amputated from the cervix with  the scalpel. The cervical pedicle was grasped with Kocher clamps and  elevated.  The cardinal ligaments were clamped with Heaney clamps,  transected, and suture ligated with 0 Vicryl.  This was repeated  on the  uterosacral ligaments. The cervix was amputated from the vagina.  The  angle sutures were placed with 0 Vicryl.  The vaginal cuff was closed  with a running stitch of 0 Vicryl.  The abdomen was copiously irrigated.  The patient was noted to have some bleeding at the vaginal cuff.  This  was repaired with a figure-of-eight suture of 0 Vicryl.  The pelvis was  copiously irrigated again.  The patient was noted to have some bleeding  from the bladder dissection.  This was repaired with 2-0 Vicryl.  There  was still some slight oozing along the bladder dissection, no copious  bleeding was noted.  Avitene was placed along this area.  Attention was  turned to the infundibulopelvic ligament pedicles.  These were found to  be hemostatic. At this point, all instruments were removed from the  pelvis including sponges and the Balfour retractor.  The fascia was  closed with 0 PDS in  a running fashion.  The subcutaneous adipose tissue  was closed with interrupted 2-0 plain gut.  The skin was closed with  staples.  Sponge, lap, and needle counts were correct x2.  2 grams of  Ancef were given at the beginning of the procedure.  Please note that  the patient did have an episode of desaturation during the procedure, a  hemoglobin was drawn and was found to be 5.  She transfused 2 units of  packed red blood cells and was transferred to the recovery room awake  and in stable condition.      Gerald Leitz, MD  Electronically Signed     TC/MEDQ  D:  12/01/2007  T:  12/02/2007  Job:  [id number]

## 2011-03-04 NOTE — Op Note (Signed)
[name], [name]                ACCOUNT NO.:  [id number]   MEDICAL RECORD NO.:  [id number]          PATIENT TYPE:  INP   LOCATION:  1416                         FACILITY:  [facility]   PHYSICIAN:  Heloise Purpura, MD      DATE OF BIRTH:  1965/11/18   DATE OF PROCEDURE:  04/06/2008  DATE OF DISCHARGE:                               OPERATIVE REPORT   PREOPERATIVE DIAGNOSES:  1. Left ureteral obstruction.  2. Endometrial cancer.   POSTOPERATIVE DIAGNOSES:  1. Left ureteral obstruction.  2. Endometrial cancer.   PROCEDURES:  1. Extensive laparoscopic adhesiolysis.  2. Robotic-assisted laparoscopic bilateral pelvic lymphadenectomy.  3. Robotic-assisted laparoscopic left ureteral reimplantation with      psoas hitch.   SURGEON:  Dr. Heloise Purpura   ASSISTANT:  Dr. Tarri Glenn.   ANESTHESIA:  General.   COMPLICATIONS:  None.   ESTIMATED BLOOD LOSS:  200 mL.   INTRAVENOUS FLUIDS:  3600 mL of lactated Ringer's.   SPECIMENS:  1. Right pelvic lymph nodes.  2. Right common iliac lymph nodes.  3. Left pelvic lymph nodes.  4. Left common iliac lymph nodes.   DISPOSITION:  Specimens to pathology.   DRAINS:  1. A #19 Blake pelvic drain.  2. A 20-French Foley catheter.  3. Left nephrostomy tube.   INDICATION:  [name] is a 45 year old female who underwent a  hysterectomy approximately three months ago.  She was found  postoperatively to have left-sided hydronephrosis and concern for a  ureteral injury.  She underwent evaluation including retrograde  pyelography in an attempt at ureteral stent placement.  However, she  appeared to have a complete ureteral obstruction at that time and  required nephrostomy tube placement.  Attempts to place a stent  antegrade were also unsuccessful.  She underwent further imaging and was  found to have a complete disruption of her left ureter distally with no  abnormalities proximal to the distal ureter.  In addition, she was found  to have  incidental endometrial cancer and has since undergone a  gynecologic oncologic evaluation.  Due to the fact that she did not  undergo a lymphadenectomy at the time of her hysterectomy, it was  requested that if she did undergo surgical treatment for left ureteral  obstruction that she undergo a pelvic lymphadenectomy for further  staging over endometrial cancer at the same time.  I have discussed this  in detail with her GYN oncologist, Dr. Lyman Bishop Nycum.  Potential risks,  complications, and alternative options to the above procedures were  discussed with the patient, and informed consent was obtained.   DESCRIPTION OF PROCEDURE:  The patient was taken to the operating room,  and a general anesthetic was administered.  She was given preoperative  antibiotics, placed in the dorsal lithotomy position, prepped and draped  in the usual sterile fashion.  Next, a preoperative time-out was  performed.  A 3-way Foley catheter had been placed preoperatively and  attached to irrigation.  A site was then selected just superior to the  umbilicus, and a 12-mm port was placed.  This port site was  placed with  an open Hassan technique which allowed entry in the peritoneal cavity  under direct vision without difficulty.  On digital palpation of the  abdominal wall, there were noted to be adhesions inferiorly in the  midline consistent with the patient's prior lower midline incision.  The  12-mm port was placed, and pneumoperitoneum was established.  A 0-degree  lens was used to inspect the abdomen, and there were noted to be  extensive adhesions in the lower midline extending down to the pelvis.  However, there were no adhesions laterally which allowed placement of  the lateral laparoscopic ports.  Bilateral 8-mm robotic ports were  placed approximately 10 cm lateral to and just inferior to the camera  port site.  An additional 8-mm robotic port was placed in the far left  lateral abdominal wall.  A  5-mm port was placed between the camera port  and the right robotic port, and an additional 12-mm port was placed in  the far right lateral abdominal wall.  All ports were placed under  direct vision and without difficulty.  With laparoscopic scissors, the  patient's adhesions between the lower abdominal wall and her  omentum/bowel were carefully taken down with combination of sharp and  cautery dissection. This required significant extra operative time  estimated to add approximately 45 minutes to the procedure. Once enough  of the more superior adhesions were taken down, the surgical cart was  docked.  Using robotic assistance, the remaining adhesions were taken  down.  The colon was then reflected medially in the left lower quadrant  as the peritoneum was incised allowing exposure of the retroperitoneum  and left ureter.  The left ureter was noted to be dilated and  significantly inflamed with some fibrotic periadventitial tissue.  The  ureter was able to be isolated with care to preserve the periureteral  blood supply.  A vessel loop was placed around the ureter for  identification purposes.  The bladder was then reflected posteriorly  allowing entry into space of Retzius, and the bladder was completely  mobilized.  Dissection proceeded down the peritoneal edges lateral to  the bladder in preparation for the pelvic lymphadenectomy.  The pelvic  lymph node dissection was then performed beginning on the right side.  The fibrofatty tissue between the genitofemoral nerve laterally, bladder  medially, hypogastric artery inferiorly and Cooper's ligament distally  was isolated from the pelvic side wall and psoas muscle with Hem-o-lok  clips used for lymphostasis and hemostasis.  The superior extent of the  lymph node dissection was up to the common iliac lymph nodes toward the  aortic bifurcation.  The common iliac lymph nodes were sent as separate  specimens.  An identical procedure was then  performed on the  contralateral side again with Hem-o-lok clips used for lymphostasis and  hemostasis.  Care was taken to preserve the obturator nerves and major  vascular structures.  A split and roll technique was utilized.  Attention then turned to the left ureter.  Dissection proceeded  distally, and the ureter was freed under the superior vesical artery.  On the right side, the superior vesical artery was divided between Hem-o-  lok clips, allowing the bladder to be further mobilized to the left  side.  Two-0 Vicryl figure-of-eight sutures were then used to secure the  bladder to the psoas tendon in a longitudinal fashion.  The ureter had  been divided as distally as possible and was spatulated.  The ureter was  examined,  and urine efflux was identified, indicating that the ureter  was divided above the level of obstruction.  The ureter was then  spatulated, and the bladder was prepared for reimplantation.  The  detrusor muscle was incised with cautery, and the mucosa was then  entered.  An 8 x 26 double-J ureteral stent was then placed after  wire  was placed into the ureter and up into the renal pelvis.  The stent was  then advanced over the wire using Seldinger technique, and the wire was  removed.  The distal curl was then placed into the bladder.  A 4-0  Vicryl interrupted suture was then placed at the apex of the spatulated  ureter and the corresponding bladder.  Two additional 4-0 Vicryl sutures  were then placed on either side of the apex of the spatulated ureter and  brought to their corresponding positions in the bladder.  Running 4-0  Vicryl sutures were then used to complete the anastomosis on either  side.  The detrusor muscle was then closed over the ureter with a 4-0  Vicryl running suture.  This anastomosis was performed in a tension-free  fashion.  The bladder was then filled with saline, and the anastomosis  appeared to be watertight.  A #19 Blake drain was brought  to the left  robotic port and positioned the pelvis appropriately.  It was secured to  skin with a nylon suture.  The surgical cart was then undocked.  The  right lateral 12-mm port site was closed with a 0 Vicryl suture placed  with the aid of the suture passer device.  All remaining ports removed  under direct vision, and the midline 12-mm port site was closed with a  running 0 Vicryl suture.  All port sites were then injected with quarter  percent Marcaine and reapproximated at the skin level with staples.  Sterile dressings were applied.  The patient appeared to tolerate the  procedure well and without complications.  Nephrostomy tube was clamped  at the end of the procedure.      Heloise Purpura, MD  Electronically Signed     LB/MEDQ  D:  04/06/2008  T:  04/06/2008  Job:  [id number]

## 2011-03-04 NOTE — Discharge Summary (Signed)
[name], Bates                ACCOUNT NO.:  [id number]   MEDICAL RECORD NO.:  [id number]          PATIENT TYPE:  INP   LOCATION:  1416                         FACILITY:  [facility]   PHYSICIAN:  Heloise Purpura, MD      DATE OF BIRTH:  July 11, 1966   DATE OF ADMISSION:  04/06/2008  DATE OF DISCHARGE:  04/10/2008                               DISCHARGE SUMMARY   ADMISSION DIAGNOSES:  1. Left ureteral obstruction.  2. Endometrial cancer.   DISCHARGE DIAGNOSES:  1. Left ureteral obstruction.  2. Endometrial cancer.   HISTORY AND PHYSICAL:  For full details, please see admission history  and physical.  Briefly, [name] is a 45 year old female who underwent  a hysterectomy approximately 3 months ago.  She had a left ureteral  injury at that time with complete obstruction of the ureter.  Attempts  to endoscopically place a ureteral stent were unsuccessful and she was  managed with a nephrostomy tube for the last 3 months.  In addition, she  was found to have a low grade endometrial cancer noted incidentally on  final pathologic review of her hysterectomy specimen.  After undergoing  further evaluation, it was felt that her left ureteral obstruction would  be best served with a ureteral reimplantation.  It was felt that this  could potentially be performed laparoscopically.  In addition, in order  to better stage her endometrial cancer, I did have a discussion with her  GYN oncologist, Dr. Thayer Ohm, who felt if a pelvic lymphadenectomy  could be performed at the same time that this would be beneficial in  managing her endometrial cancer and would potentially spare her needing  radiation therapy.   HOSPITAL COURSE:  On April 06, 2008 the patient was taken to the  operating room and underwent a robotic assisted laparoscopic bilateral  pelvic lymphadenectomy and left ureteral reimplantation with psoas  hitch.  She did have extensive intraabdominal adhesions, which were  taken down  laparoscopically.  She tolerated her procedure well and  without complications, and postoperatively was able to be transferred to  a regular hospital room.  Her activity and diet were gradually advanced  over the next few days until she had return of bowel function and was  able to tolerate a regular diet.  In addition, her pain was well  controlled and she was gradually transitioned to oral pain medication.  On postoperative day #2, her pelvic drain fluid was sent for a  creatinine level and was found to be consistent with serum at 0.9.  In  addition, she remained hemodynamically stable with her hematocrit on  postoperative day #1 being 32.2.  Her renal function also remained  stable with her serum creatinine at 1.0.  On postoperative day #3, she  had for the most part met all discharge criteria.  However, it was felt  that her nephrostomy tube would be best removed under fluoroscopy so as  not to dislodge her ureteral stent, which was placed at the time of her  operation.  Thereof re, on the morning of postoperative day #4, her  nephrostomy  tube was removed under fluoroscopy, and she was subsequently  discharged home with her Foley catheter.   DISPOSITION:  Home.   DISCHARGE MEDICATIONS:  She was instructed to resume her regular home  medications excepting any aspirin, nonsteroidal anti-inflammatory drugs,  or herbal supplements.  She was given a prescription to take Vicodin as  needed for pain and told to use Colace as a stool softener.   DISCHARGE INSTRUCTIONS:  She was instructed to ambulate as tolerated,  but was instructed to refrain from any heavy lifting, strenuous  activity, or driving.  She was instructed on routine Foley catheter care  and discharged home with a leg bag.  She was told to resume a regular  diet.   FOLLOWUP:  She will return later this week for removal of her Foley  catheter and to discuss her surgical pathology results.   ADDENDUM:  After the patient had  been discharged, her final pathology  did return, which demonstrated a total of 20 pelvic lymph nodes, which  were all negative for malignancy.  Her final pathology report will be  sent to Dr. Thayer Ohm at [facility] in [place],  [place].      Heloise Purpura, MD  Electronically Signed     LB/MEDQ  D:  04/10/2008  T:  04/10/2008  Job:  [id number]   cc:   Thayer Ohm, Dr.  [facility]  GYN Oncology  [place], [place] [address]

## 2011-03-07 NOTE — Procedures (Signed)
[name], [name]                ACCOUNT NO.:  [id number]   MEDICAL RECORD NO.:  [id number]          PATIENT TYPE:  OUT   LOCATION:  SLEEP CENTER                 FACILITY:  [facility]   PHYSICIAN:  Clinton D. Maple Hudson, M.D. DATE OF BIRTH:  [birth year]   DATE OF STUDY:  04/18/2005                              NOCTURNAL POLYSOMNOGRAM   REFERRING PHYSICIAN:  C. Duane Lope, MD   INDICATION FOR STUDY:  Hypersomnia with sleep apnea.   EPWORTH SLEEP SCORE:  Epworth sleep score 18/24, BMI 44.9, weight 297  pounds.   SLEEP ARCHITECTURE:  Short total sleep time 249 minutes with sleep  efficiency 61%. Stage 1 was 8%, stage 2 48%. Stages 3 and 4 were 31%, REM  was 14% of total sleep time. Sleep latency 37 minutes. REM latency 134  minutes. Awake after sleep onset 124 minutes. Arousal index increased at 60.  No bedtime medications were taken. She was unable to sustain sleep before  12:30 and she was awake again for most of the interval between 3 a.m. and  4:30 a.m., complaining of nasal congestion and inability to breathe. The  technician provided Breath-Right strips and normal saline spray which helped  modestly. The patient slept primarily on the left side.   Respiratory disturbance index (RDI, AHI) 83 obstructive events/Hr.  indicating severe obstructive sleep apnea/hypopnea syndrome. There were 64  events for the central, 127 obstructive events and 154 hypopnea's, those two  events, light flow sleep, were noted while on the left side. REM RDI 104.  Split study protocol could not be utilized because of insufficient sleep and  delayed sleep onset.   OXYGEN DATA:  Moderate snoring with oxygen desaturation to a nadir of 73%.  Mean oxygen saturation through the study was 93% on room air.   CARDIAC DATA:  Normal sinus rhythm.   PARASOMNIA:  Rare leg jerk.   IMPRESSION/RECOMMENDATION:  1.  Severe obstructive sleep apnea/hypopnea syndrome, RDI 83/Hr. with      moderate snoring and oxygen  desaturation to 73%.  2.  Difficulty initiating and maintaining sleep was aggravated by her nasal      congestion and respiratory events.      Deterrent: CPAP titration, bringing a sedative hypnotic if clinically      appropriate. Otherwise evaluate for alternative therapies with      particular attention to upper airway and nasal congestion.      Clinton D. Maple Hudson, M.D.  Diplomat    CDY/MEDQ  D:  05/04/2005 10:41:30  T:  05/04/2005 18:27:45  Job:  [id number]

## 2011-05-29 ENCOUNTER — Ambulatory Visit
Admission: RE | Admit: 2011-05-29 | Discharge: 2011-05-29 | Disposition: A | Discharge status: Home / Self Care | Payer: Self-pay | Source: Ambulatory Visit | Attending: Obstetrics and Gynecology | Admitting: Obstetrics and Gynecology

## 2011-05-29 ENCOUNTER — Other Ambulatory Visit: Payer: Self-pay | Admitting: Obstetrics and Gynecology

## 2011-05-29 DIAGNOSIS — C549 Malignant neoplasm of corpus uteri, unspecified: Secondary | ICD-10-CM

## 2011-07-11 LAB — CBC
HCT: 38.5
HCT: 24.6 — ABNORMAL LOW
Hemoglobin: 13.2
Hemoglobin: 8.5 — ABNORMAL LOW
Hemoglobin: 8.5 — ABNORMAL LOW
MCHC: 34.2
MCHC: 34.4
MCHC: 34.3
MCV: 82.6
MCV: 79.8
MCV: 78.4
Platelets: 276
Platelets: 252
RBC: 3.08 — ABNORMAL LOW
RBC: 3.22 — ABNORMAL LOW
RDW: 14.3
WBC: 8
WBC: 6.9

## 2011-07-11 LAB — TYPE AND SCREEN
ABO/RH(D): AB POS
Antibody Screen: NEGATIVE

## 2011-07-11 LAB — URINALYSIS, ROUTINE W REFLEX MICROSCOPIC
Glucose, UA: 250 — AB
Specific Gravity, Urine: 1.02
Urobilinogen, UA: >8 — ABNORMAL HIGH

## 2011-07-11 LAB — URINALYSIS, DIPSTICK ONLY
Bilirubin Urine: NEGATIVE
Glucose, UA: NEGATIVE
Ketones, ur: NEGATIVE
Protein, ur: 30 — AB
pH: 6

## 2011-07-11 LAB — DIFFERENTIAL
Basophils Absolute: 0
Basophils Relative: 1
Basophils Relative: 0
Eosinophils Absolute: 0.1
Eosinophils Absolute: 0.3
Eosinophils Relative: 2
Lymphocytes Relative: 28
Lymphs Abs: 2.2
Monocytes Absolute: 0.4
Monocytes Absolute: 0.5
Monocytes Relative: 6
Neutro Abs: 5.1
Neutrophils Relative %: 63
Neutrophils Relative %: 71

## 2011-07-11 LAB — BASIC METABOLIC PANEL
BUN: 11
BUN: 16
BUN: 8
BUN: 9
CO2: 25
CO2: 24
CO2: 25
CO2: 26
Calcium: 8.3 — ABNORMAL LOW
Calcium: 8.9
Chloride: 101
Chloride: 106
Chloride: 101
Creatinine, Ser: 0.76
Creatinine, Ser: 1.66 — ABNORMAL HIGH
Creatinine, Ser: 1.28 — ABNORMAL HIGH
Creatinine, Ser: 1.4 — ABNORMAL HIGH
GFR calc Af Amer: 53 — ABNORMAL LOW
GFR calc Af Amer: >60
GFR calc non Af Amer: >60
GFR calc non Af Amer: 41 — ABNORMAL LOW
Glucose, Bld: 95
Glucose, Bld: 105 — ABNORMAL HIGH
Glucose, Bld: 127 — ABNORMAL HIGH
Glucose, Bld: 106 — ABNORMAL HIGH
Potassium: 3.7
Potassium: 4.4
Potassium: 3.5
Sodium: 136
Sodium: 137
Sodium: 138

## 2011-07-11 LAB — CARBOXYHEMOGLOBIN
O2 Saturation: 89.5
Total hemoglobin: 5.6 — CL

## 2011-07-11 LAB — URINE MICROSCOPIC-ADD ON

## 2011-07-11 LAB — PREGNANCY, URINE: Preg Test, Ur: NEGATIVE

## 2011-07-15 LAB — BASIC METABOLIC PANEL
BUN: 12
Creatinine, Ser: 1.2
GFR calc non Af Amer: 49 — ABNORMAL LOW

## 2011-07-15 LAB — HEMOGLOBIN AND HEMATOCRIT, BLOOD
HCT: 31.8 — ABNORMAL LOW
Hemoglobin: 10 — ABNORMAL LOW

## 2011-07-17 LAB — CBC
MCHC: 32.2
RBC: 5.27 — ABNORMAL HIGH
WBC: 8

## 2011-07-17 LAB — BASIC METABOLIC PANEL
CO2: 27
Calcium: 9.6
Chloride: 103
Chloride: 104
Creatinine, Ser: 0.95
Creatinine, Ser: 1.01
Creatinine, Ser: 1.01
GFR calc Af Amer: >60
GFR calc Af Amer: >60
GFR calc Af Amer: >60
GFR calc non Af Amer: 60 — ABNORMAL LOW
Potassium: 4.1
Potassium: 4.4

## 2011-07-17 LAB — HEMOGLOBIN AND HEMATOCRIT, BLOOD
HCT: 32.2 — ABNORMAL LOW
HCT: 36.8
Hemoglobin: 10.4 — ABNORMAL LOW
Hemoglobin: 11.8 — ABNORMAL LOW

## 2011-07-17 LAB — TYPE AND SCREEN
ABO/RH(D): AB POS
Antibody Screen: POSITIVE
DAT, IgG: NEGATIVE
Donor AG Type: NEGATIVE

## 2011-07-17 LAB — CREATININE, FLUID (PLEURAL, PERITONEAL, JP DRAINAGE)

## 2011-07-22 LAB — BASIC METABOLIC PANEL
BUN: 15
CO2: 29
Calcium: 9.4
Chloride: 104
Creatinine, Ser: 0.8
GFR calc Af Amer: >60

## 2012-03-04 ENCOUNTER — Other Ambulatory Visit: Payer: Self-pay | Admitting: Obstetrics and Gynecology

## 2012-03-04 DIAGNOSIS — Z1231 Encounter for screening mammogram for malignant neoplasm of breast: Secondary | ICD-10-CM

## 2012-03-19 ENCOUNTER — Ambulatory Visit
Admission: RE | Admit: 2012-03-19 | Discharge: 2012-03-19 | Disposition: A | Discharge status: Home / Self Care | Payer: Commercial Indemnity | Source: Ambulatory Visit | Attending: Obstetrics and Gynecology | Admitting: Obstetrics and Gynecology

## 2012-03-19 ENCOUNTER — Ambulatory Visit: Payer: Self-pay

## 2012-03-19 DIAGNOSIS — Z1231 Encounter for screening mammogram for malignant neoplasm of breast: Secondary | ICD-10-CM

## 2012-08-26 ENCOUNTER — Ambulatory Visit
Admission: RE | Admit: 2012-08-26 | Discharge: 2012-08-26 | Disposition: A | Discharge status: Home / Self Care | Payer: Managed Care, Other (non HMO) | Source: Ambulatory Visit | Attending: Obstetrics and Gynecology | Admitting: Obstetrics and Gynecology

## 2012-08-26 ENCOUNTER — Other Ambulatory Visit: Payer: Self-pay | Admitting: Obstetrics and Gynecology

## 2012-08-26 DIAGNOSIS — C541 Malignant neoplasm of endometrium: Secondary | ICD-10-CM

## 2013-08-26 ENCOUNTER — Other Ambulatory Visit: Payer: Self-pay | Admitting: Gastroenterology

## 2016-07-03 ENCOUNTER — Other Ambulatory Visit: Payer: Self-pay | Admitting: Family Medicine

## 2016-07-03 ENCOUNTER — Other Ambulatory Visit (HOSPITAL_COMMUNITY)
Admission: RE | Admit: 2016-07-03 | Discharge: 2016-07-03 | Disposition: A | Discharge status: Home / Self Care | Payer: Managed Care, Other (non HMO) | Source: Ambulatory Visit | Attending: Family Medicine | Admitting: Family Medicine

## 2016-07-03 DIAGNOSIS — Z124 Encounter for screening for malignant neoplasm of cervix: Secondary | ICD-10-CM | POA: Insufficient documentation

## 2016-07-04 LAB — CYTOLOGY - PAP

## 2017-10-29 ENCOUNTER — Other Ambulatory Visit: Payer: Self-pay | Admitting: Family Medicine

## 2017-10-29 DIAGNOSIS — Z1231 Encounter for screening mammogram for malignant neoplasm of breast: Secondary | ICD-10-CM

## 2018-09-24 ENCOUNTER — Encounter: Payer: Self-pay | Admitting: Radiology

## 2018-09-24 ENCOUNTER — Ambulatory Visit
Admission: RE | Admit: 2018-09-24 | Discharge: 2018-09-24 | Disposition: A | Discharge status: Home / Self Care | Payer: Managed Care, Other (non HMO) | Source: Ambulatory Visit | Attending: Family Medicine | Admitting: Family Medicine

## 2018-09-24 DIAGNOSIS — Z1231 Encounter for screening mammogram for malignant neoplasm of breast: Secondary | ICD-10-CM

## 2018-09-28 ENCOUNTER — Other Ambulatory Visit: Payer: Self-pay | Admitting: Family Medicine

## 2018-09-28 DIAGNOSIS — R928 Other abnormal and inconclusive findings on diagnostic imaging of breast: Secondary | ICD-10-CM

## 2018-10-01 ENCOUNTER — Other Ambulatory Visit: Payer: Self-pay | Admitting: Family Medicine

## 2018-10-01 ENCOUNTER — Ambulatory Visit
Admission: RE | Admit: 2018-10-01 | Discharge: 2018-10-01 | Disposition: A | Discharge status: Home / Self Care | Payer: Managed Care, Other (non HMO) | Source: Ambulatory Visit | Attending: Family Medicine | Admitting: Family Medicine

## 2018-10-01 DIAGNOSIS — N6310 Unspecified lump in the right breast, unspecified quadrant: Secondary | ICD-10-CM

## 2018-10-01 DIAGNOSIS — R928 Other abnormal and inconclusive findings on diagnostic imaging of breast: Secondary | ICD-10-CM

## 2018-10-05 ENCOUNTER — Other Ambulatory Visit: Payer: Self-pay | Admitting: Family Medicine

## 2018-10-05 DIAGNOSIS — N63 Unspecified lump in unspecified breast: Secondary | ICD-10-CM

## 2019-04-04 ENCOUNTER — Other Ambulatory Visit: Payer: Managed Care, Other (non HMO)

## 2019-04-11 ENCOUNTER — Other Ambulatory Visit: Payer: Self-pay

## 2019-04-11 ENCOUNTER — Other Ambulatory Visit: Payer: Self-pay | Admitting: Family Medicine

## 2019-04-11 ENCOUNTER — Ambulatory Visit
Admission: RE | Admit: 2019-04-11 | Discharge: 2019-04-11 | Disposition: A | Discharge status: Home / Self Care | Payer: Managed Care, Other (non HMO) | Source: Ambulatory Visit | Attending: Family Medicine | Admitting: Family Medicine

## 2019-04-11 DIAGNOSIS — N63 Unspecified lump in unspecified breast: Secondary | ICD-10-CM

## 2019-09-28 ENCOUNTER — Ambulatory Visit
Admission: RE | Admit: 2019-09-28 | Discharge: 2019-09-28 | Disposition: A | Discharge status: Home / Self Care | Payer: Managed Care, Other (non HMO) | Source: Ambulatory Visit | Attending: Family Medicine | Admitting: Family Medicine

## 2019-09-28 ENCOUNTER — Other Ambulatory Visit: Payer: Self-pay

## 2019-09-28 DIAGNOSIS — N63 Unspecified lump in unspecified breast: Secondary | ICD-10-CM

## 2020-10-09 ENCOUNTER — Other Ambulatory Visit: Payer: Self-pay | Admitting: Family Medicine

## 2020-10-09 DIAGNOSIS — E2839 Other primary ovarian failure: Secondary | ICD-10-CM

## 2020-11-14 ENCOUNTER — Other Ambulatory Visit: Payer: Self-pay | Admitting: Family Medicine

## 2020-11-14 DIAGNOSIS — Z09 Encounter for follow-up examination after completed treatment for conditions other than malignant neoplasm: Secondary | ICD-10-CM

## 2020-11-26 ENCOUNTER — Ambulatory Visit
Admission: RE | Admit: 2020-11-26 | Discharge: 2020-11-26 | Disposition: A | Discharge status: Home / Self Care | Payer: PRIVATE HEALTH INSURANCE | Source: Ambulatory Visit | Attending: Family Medicine | Admitting: Family Medicine

## 2020-11-26 ENCOUNTER — Other Ambulatory Visit: Payer: Self-pay

## 2020-11-26 DIAGNOSIS — E2839 Other primary ovarian failure: Secondary | ICD-10-CM

## 2020-12-19 ENCOUNTER — Other Ambulatory Visit: Payer: Self-pay | Admitting: Family Medicine

## 2020-12-19 DIAGNOSIS — N6002 Solitary cyst of left breast: Secondary | ICD-10-CM

## 2020-12-24 ENCOUNTER — Other Ambulatory Visit: Payer: Managed Care, Other (non HMO)

## 2021-01-07 ENCOUNTER — Other Ambulatory Visit: Payer: Self-pay

## 2021-01-25 ENCOUNTER — Ambulatory Visit
Admission: RE | Admit: 2021-01-25 | Discharge: 2021-01-25 | Disposition: A | Discharge status: Home / Self Care | Payer: PRIVATE HEALTH INSURANCE | Source: Ambulatory Visit | Attending: Family Medicine | Admitting: Family Medicine

## 2021-01-25 ENCOUNTER — Other Ambulatory Visit: Payer: Self-pay

## 2021-01-25 DIAGNOSIS — N6002 Solitary cyst of left breast: Secondary | ICD-10-CM

## 2021-10-17 ENCOUNTER — Ambulatory Visit (INDEPENDENT_AMBULATORY_CARE_PROVIDER_SITE_OTHER): Payer: PRIVATE HEALTH INSURANCE

## 2021-10-17 ENCOUNTER — Ambulatory Visit (INDEPENDENT_AMBULATORY_CARE_PROVIDER_SITE_OTHER): Payer: PRIVATE HEALTH INSURANCE | Admitting: Pulmonary Disease

## 2021-10-17 ENCOUNTER — Encounter: Payer: Self-pay | Admitting: Pulmonary Disease

## 2021-10-17 ENCOUNTER — Other Ambulatory Visit: Payer: Self-pay

## 2021-10-17 VITALS — BP 130/72 | HR 73 | Temp 98.0°F | Ht 66.0 in | Wt 346.8 lb

## 2021-10-17 DIAGNOSIS — R0609 Other forms of dyspnea: Secondary | ICD-10-CM

## 2021-10-17 MED ORDER — FLUTICASONE-SALMETEROL 500-50 MCG/ACT IN AEPB: 1.0000 | INHALATION_SPRAY | Freq: Two times a day (BID) | RESPIRATORY_TRACT | 6 refills | Status: DC

## 2021-10-17 NOTE — Patient Instructions (Addendum)
Nice to meet you  I worry some symptoms are related to asthma given your history and worsening after covid  Use Advair 1 puff in the morning and 1 puff in the evening - rinse your mouth after every use  Use albuterol as needed for shortness of breath  Chest xray today to make sure lungs look clear  It is a good idea to check in with Dr. Maxwell Caul and the CPAP since you are having headaches and fatigue.  Return to clinic in 3 months or sooner as needed

## 2022-05-31 ENCOUNTER — Other Ambulatory Visit: Payer: Self-pay | Admitting: Pulmonary Disease

## 2022-05-31 DIAGNOSIS — R0609 Other forms of dyspnea: Secondary | ICD-10-CM

## 2022-06-30 ENCOUNTER — Other Ambulatory Visit: Payer: Self-pay | Admitting: Pulmonary Disease

## 2022-06-30 DIAGNOSIS — R0609 Other forms of dyspnea: Secondary | ICD-10-CM

## 2023-10-06 ENCOUNTER — Other Ambulatory Visit: Payer: Self-pay | Admitting: Pulmonary Disease

## 2023-10-06 DIAGNOSIS — R0609 Other forms of dyspnea: Secondary | ICD-10-CM
